# Patient Record
Sex: Female | Born: 1960 | Race: Black or African American | Hispanic: No | Marital: Married | Smoking: Current some day smoker
Health system: Southern US, Community
[De-identification: ages and names within clinical notes are randomized; demographics above are authoritative.]

---

## 2001-09-02 IMAGING — MG UNKNOWN MG STUDY
1 series · 4 of 4 positions shown · non-contrast
Comparison: none

REASON FOR EXAM: scr..hm[PHONE_NUMBER]

Procedure: DIGITAL SCREENING MAMMOGRAM WITH CAD:
 Comparison is made to prior study dated [DATE].
 The breasts are primarily fatty replaced. There is no evidence of malignant
type calcifications, regions of architectural distortion, spiculated masses
or densities.

[Series 6326: R CC · right · 4 of 4 slices shown]
[im 1/4]
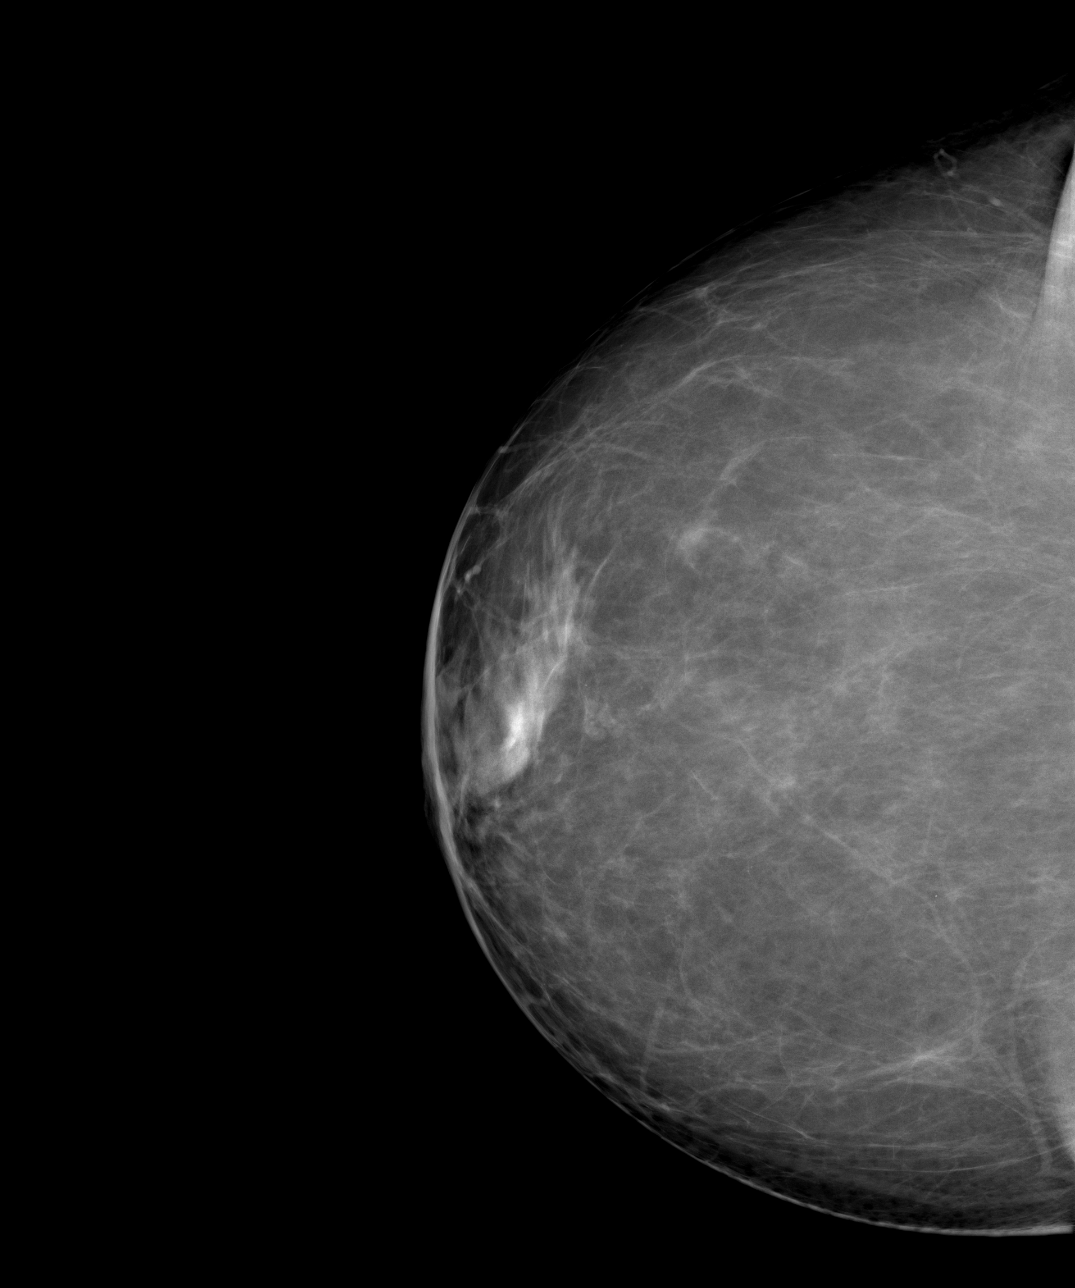
[im 2/4]
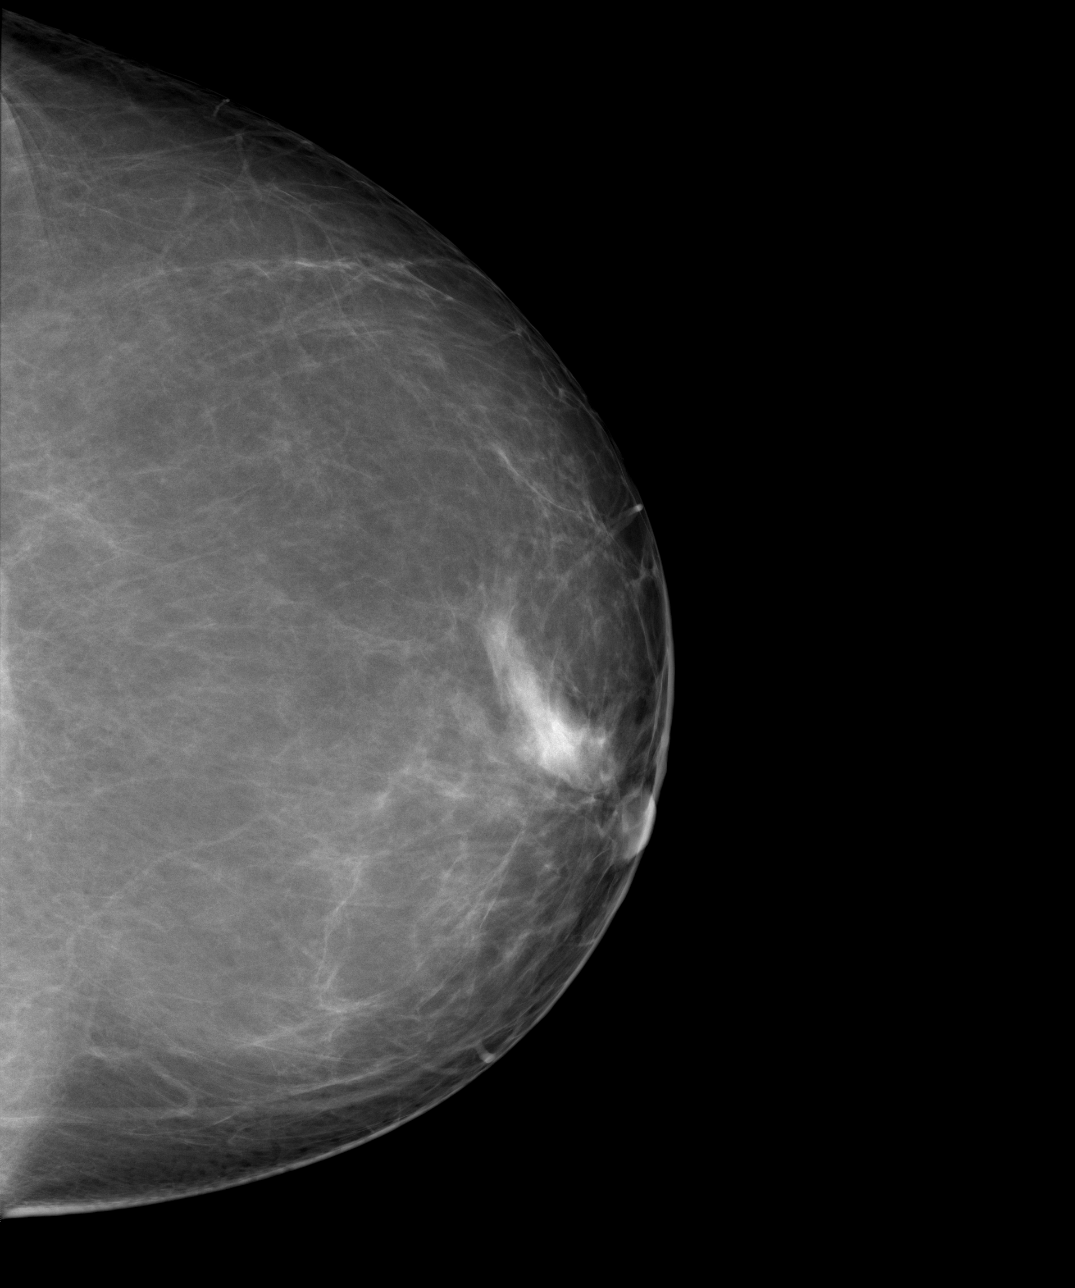
[im 3/4]
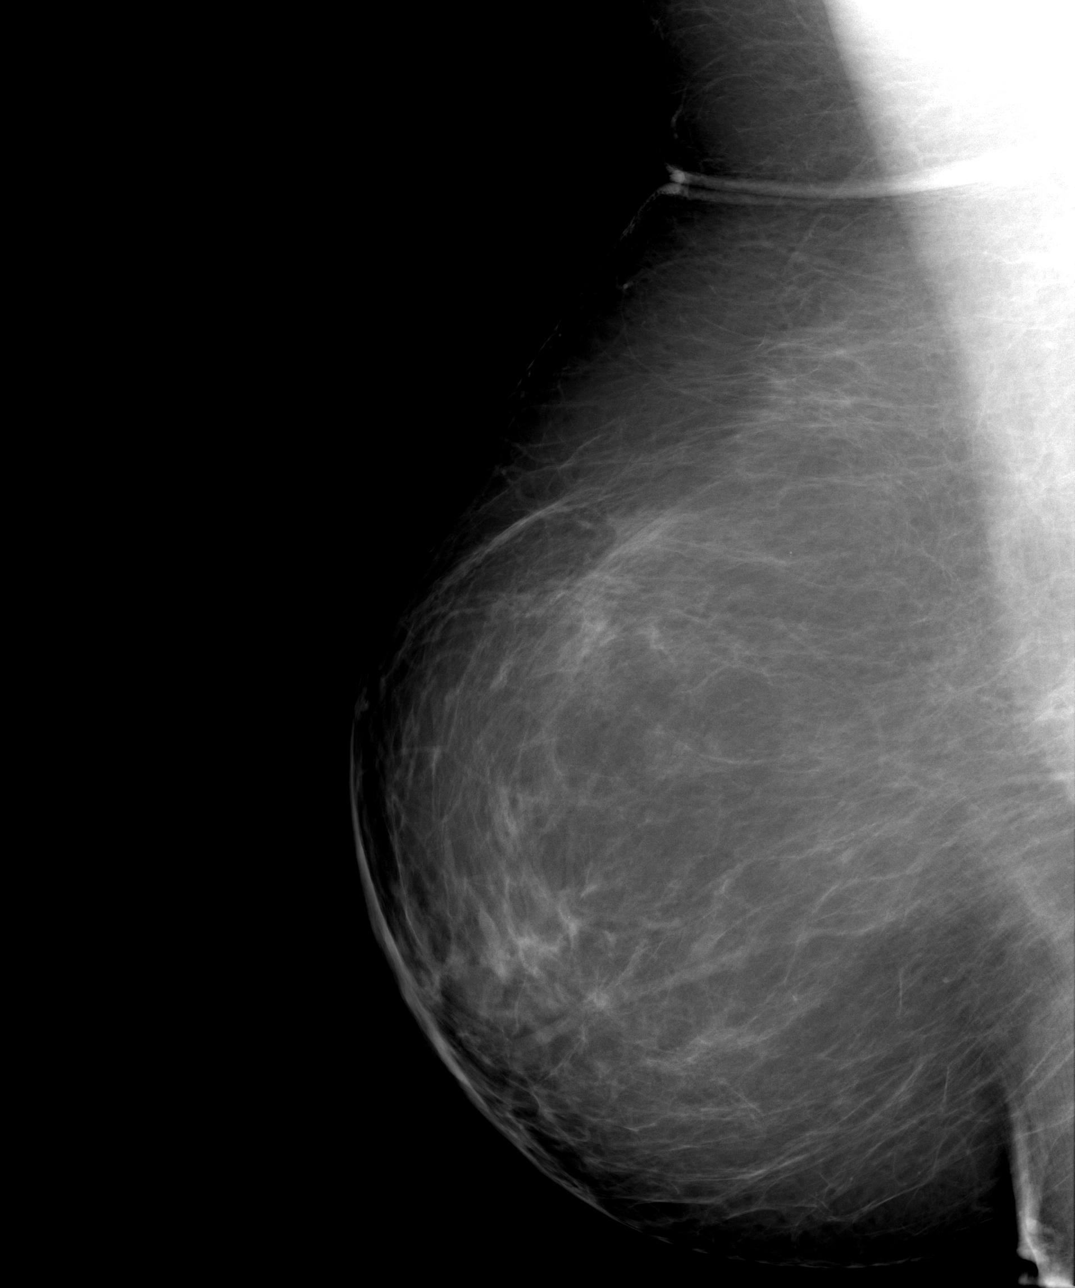
[im 4/4]
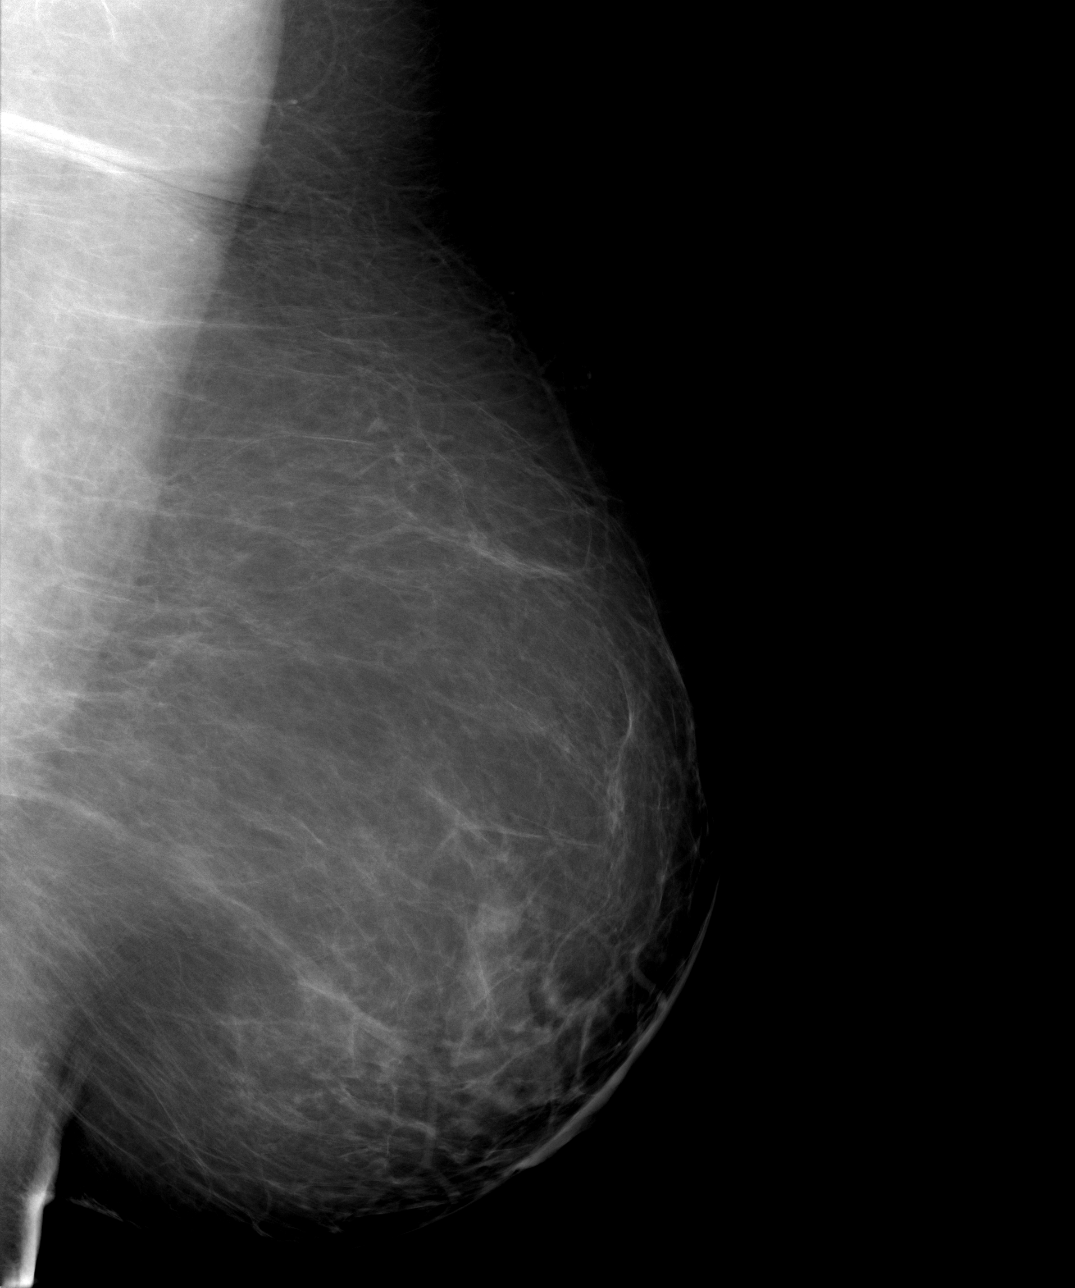

[4 of 4 positions shown; findings below may reference images not displayed]

IMPRESSION: 1) Benign findings.
 2) BI-RADS: Category 2-Benign Finding mammogram and annual follow-up is
recommended.

 A NEGATIVE MAMMOGRAM REPORT DOES NOT PRECLUDE BIOPSY OR OTHER EVALUATION OF
A CLINICALLY PALPABLE OR OTHERWISE SUSPICIOUS MASS OR LESION. BREAST CANCER
MAY NOT BE DETECTED BY MAMMOGRAPHY IN UP TO 10% OF CASES.

## 2002-09-05 IMAGING — MG UNKNOWN MG STUDY
1 series · 4 of 4 positions shown · non-contrast
Comparison: none

REASON FOR EXAM: Screening

Procedure: DIGITAL BILATERAL SCREENING MAMMOGRAPHY WITH CAD
 No dominant masses or pathologic clustered calcifications are demonstrated.
The exam is stable from prior exams. Routine yearly follow up is
recommended.

[Series 7481: R CC · right · 4 of 4 slices shown]
[im 1/4]
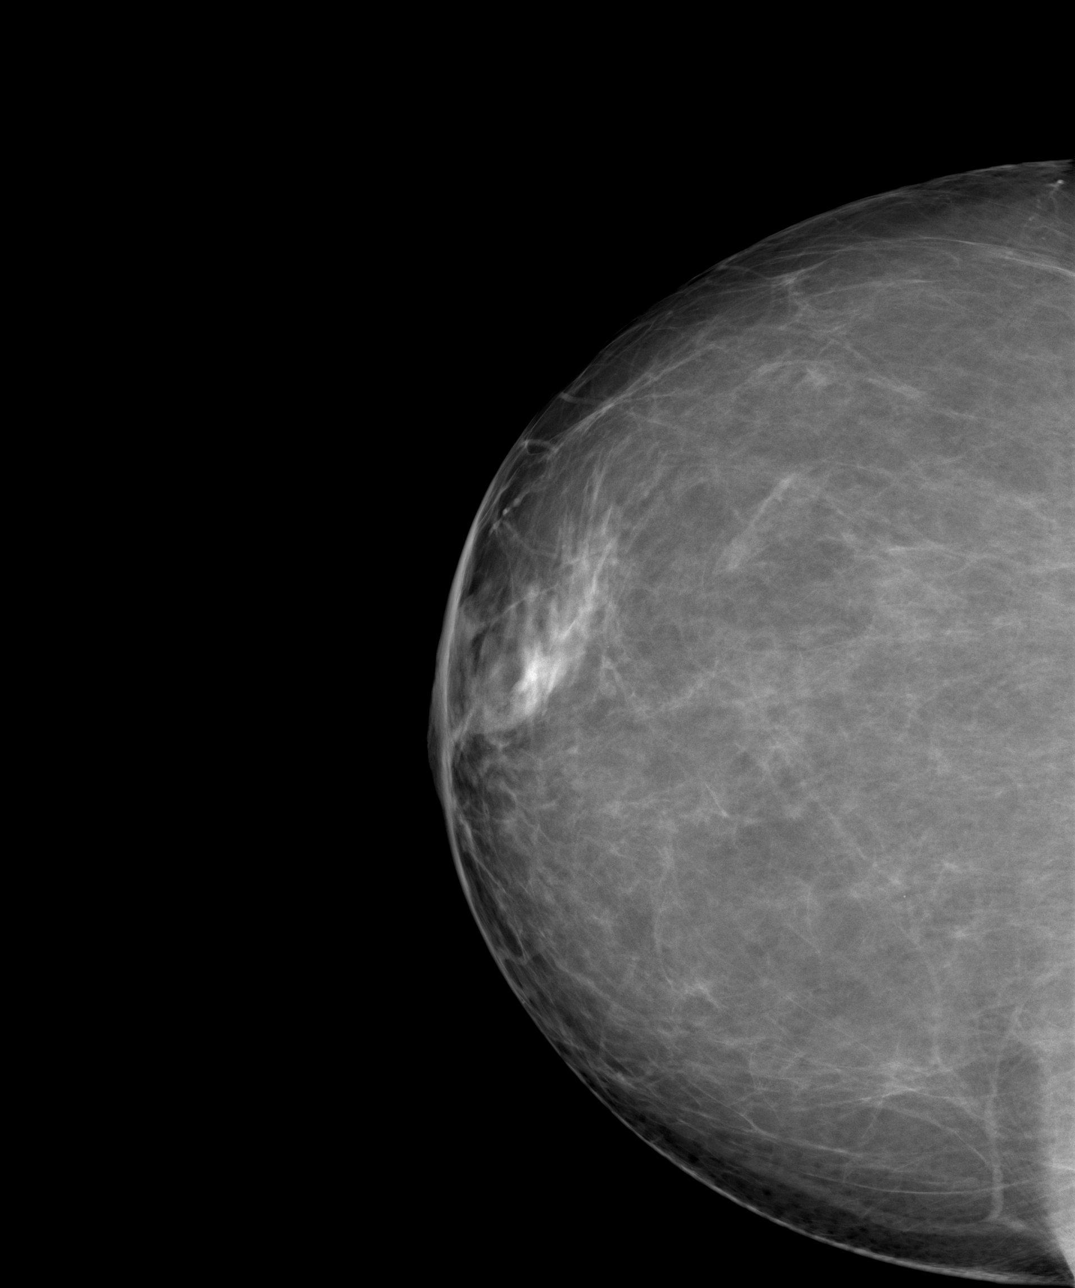
[im 2/4]
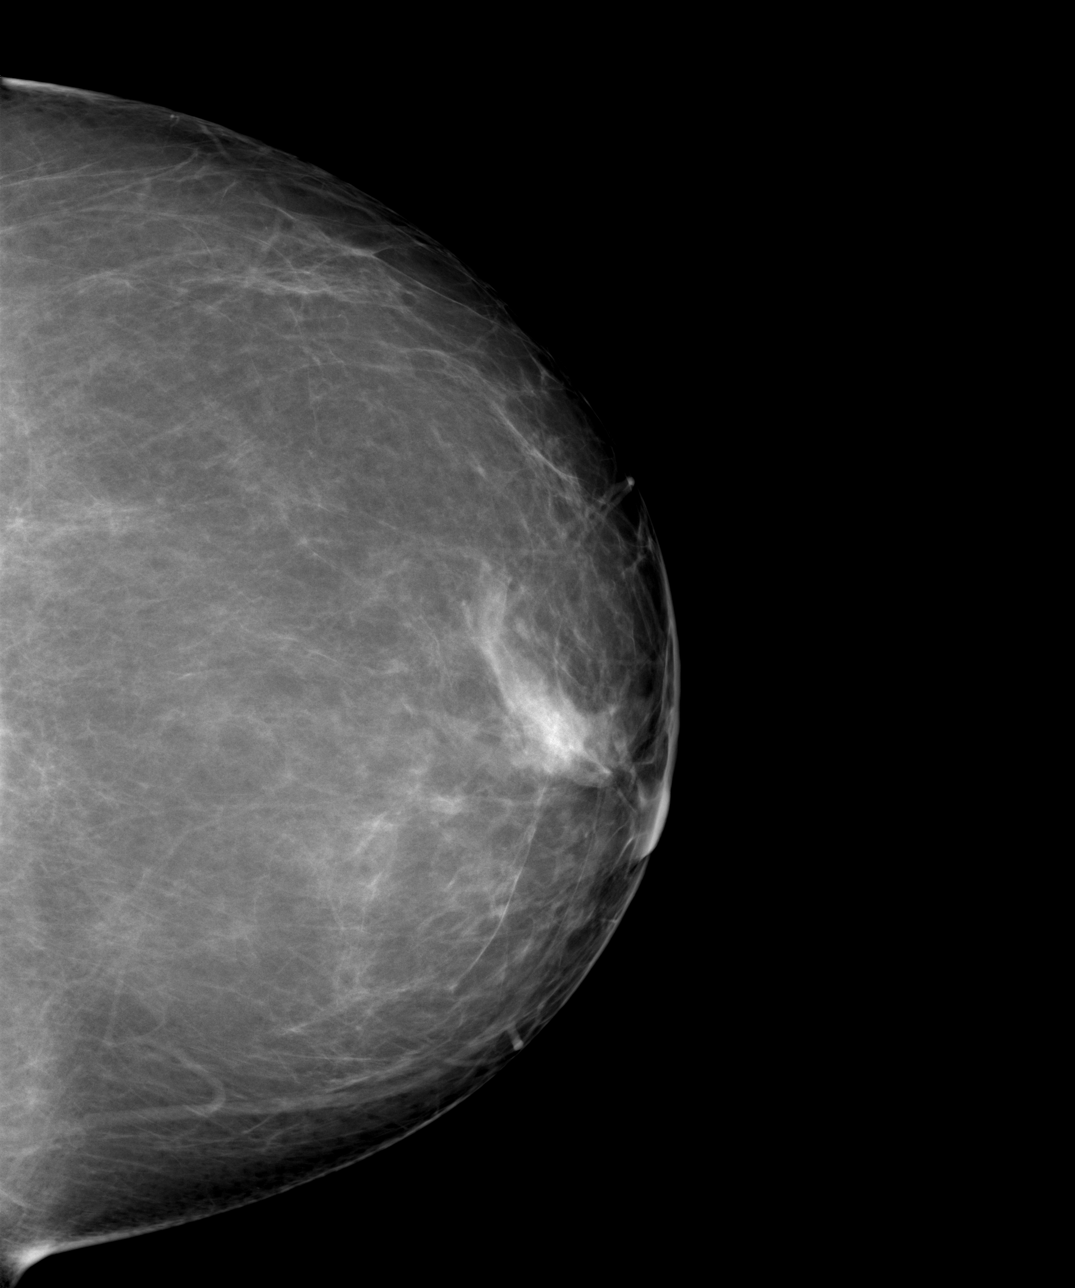
[im 3/4]
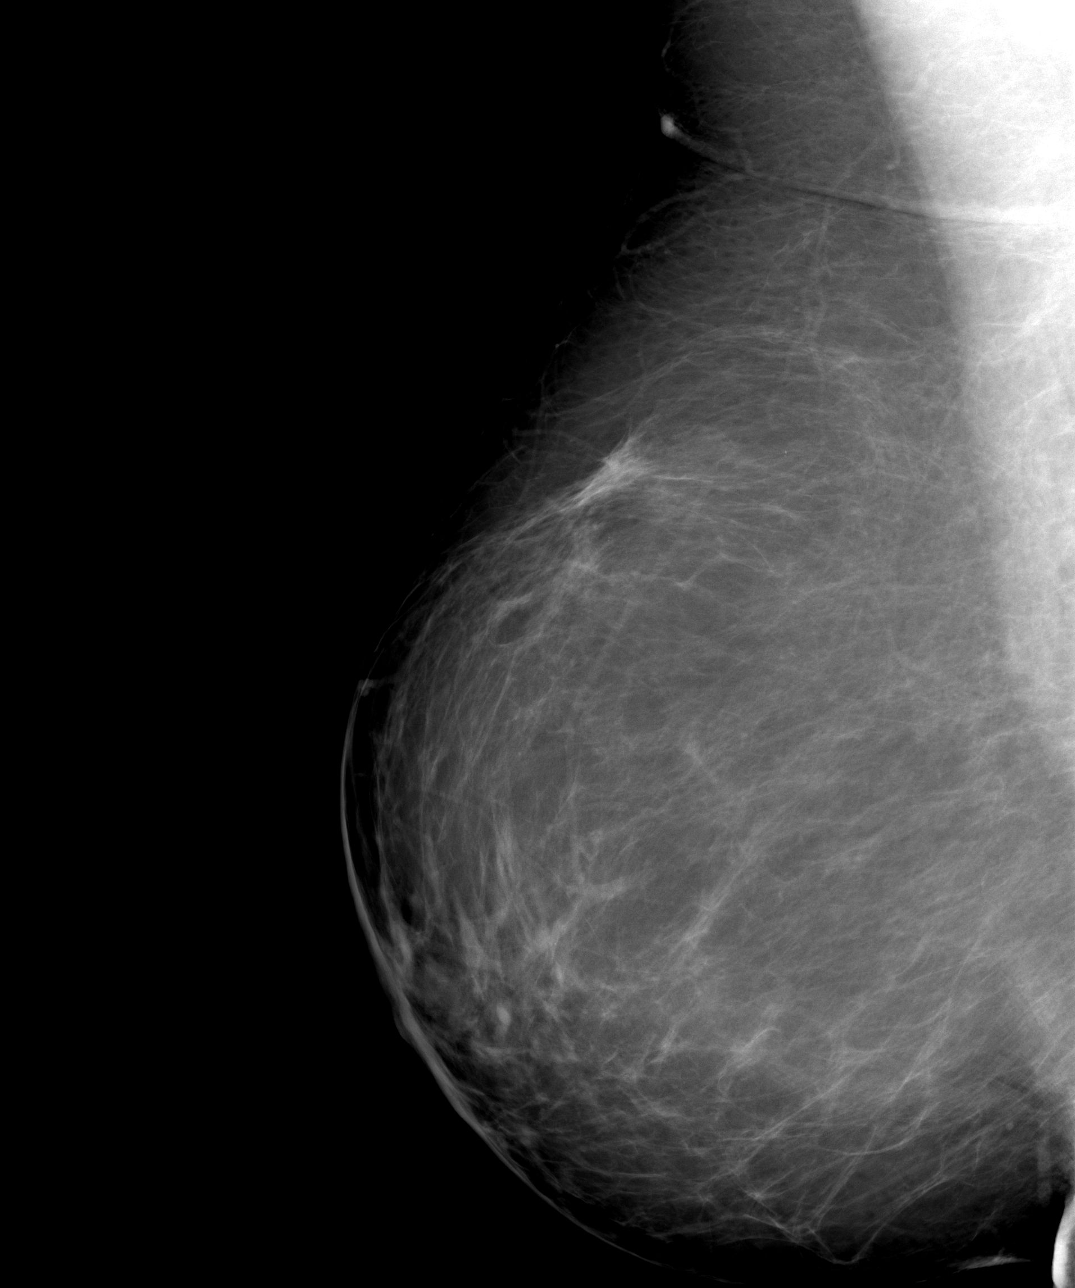
[im 4/4]
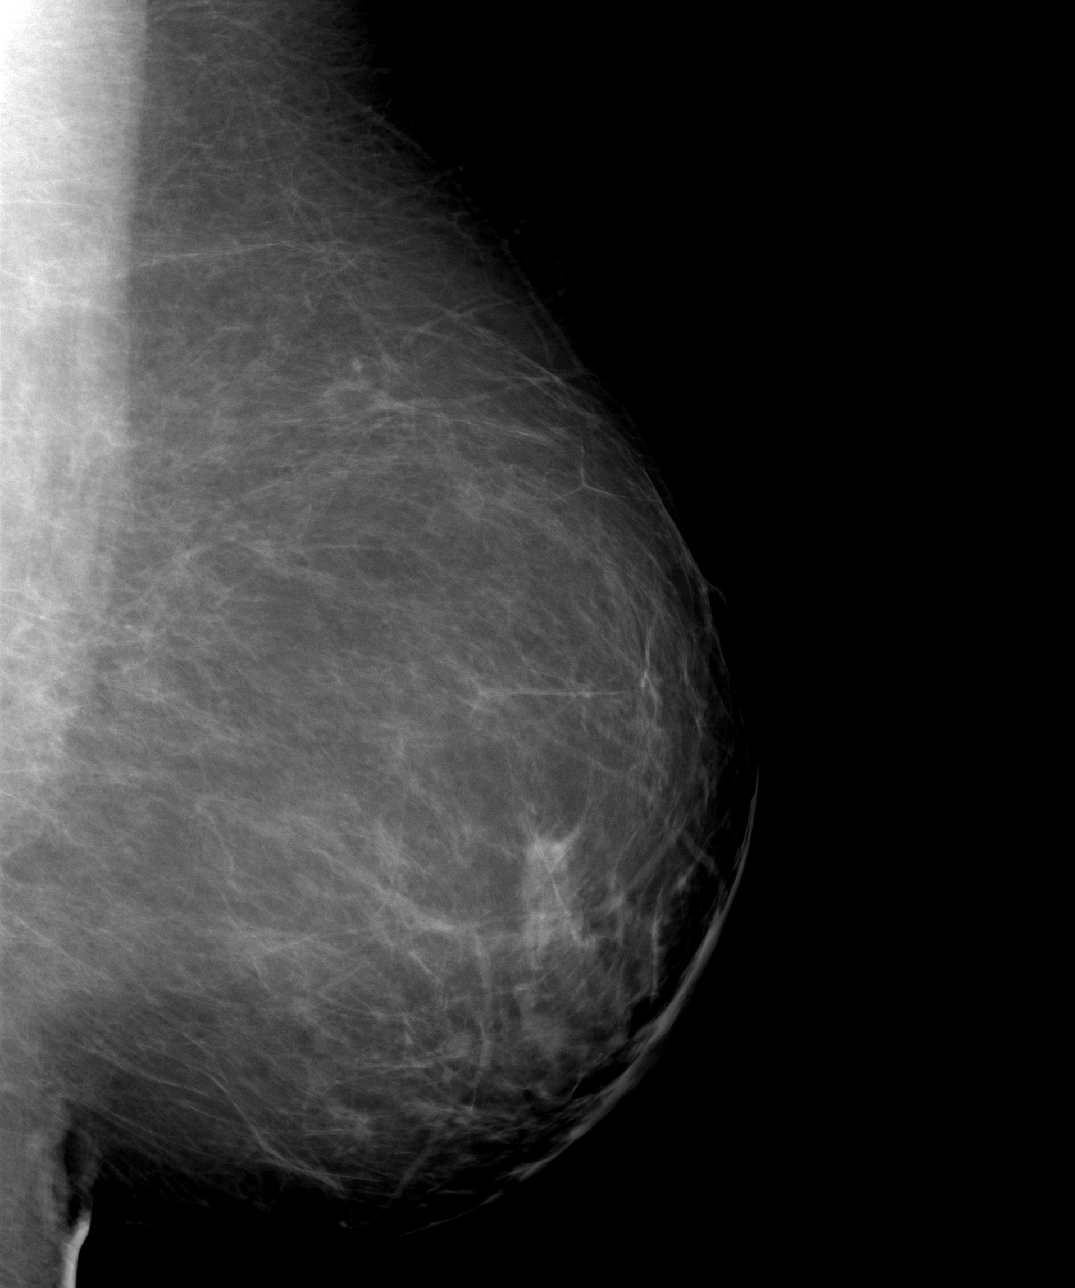

[4 of 4 positions shown; findings below may reference images not displayed]

IMPRESSION: Stable benign exam. Yearly follow up mammogram is suggested. BI-RADS:
Category 2-Benign Findings.

 A NEGATIVE MAMMOGRAM REPORT DOES NOT PRECLUDE BIOPSY OR OTHER EVALUATION OF
A CLINICALLY PALPABLE OR OTHERWISE SUSPICIOUS MASS OR LESION. BREAST CANCER
MAY NOT BE DETECTED BY MAMMOGRAPHY IN UP TO 10% OF CASES.

## 2005-12-01 ENCOUNTER — Ambulatory Visit: Payer: Self-pay | Admitting: Unknown Physician Specialty

## 2007-03-03 ENCOUNTER — Ambulatory Visit: Payer: Self-pay

## 2010-02-26 ENCOUNTER — Ambulatory Visit: Payer: Self-pay

## 2019-09-01 ENCOUNTER — Ambulatory Visit: Payer: Self-pay | Attending: Internal Medicine

## 2019-09-01 DIAGNOSIS — Z23 Encounter for immunization: Secondary | ICD-10-CM

## 2019-09-01 NOTE — Progress Notes (Signed)
   Covid-19 Vaccination Clinic  Name:  Sonia Holmes    MRN: [id number] DOB: 06/09/1960  09/01/2019  [name] was observed post Covid-19 immunization for 15 minutes without incident. She was provided with Vaccine Information Sheet and instruction to access the V-Safe system.   [name] was instructed to call 911 with any severe reactions post vaccine: Marland Kitchen Difficulty breathing  . Swelling of face and throat  . A fast heartbeat  . A bad rash all over body  . Dizziness and weakness   Immunizations Administered    Name Date Dose VIS Date Route   Pfizer COVID-19 Vaccine 09/01/2019  9:34 AM 0.3 mL 06/14/2018 Intramuscular   Manufacturer: [employer], [employer]   Lot: [id number]   NDC: [address]-9

## 2019-09-22 ENCOUNTER — Ambulatory Visit: Payer: Self-pay

## 2019-09-29 ENCOUNTER — Ambulatory Visit: Payer: Self-pay | Attending: Oncology

## 2019-09-29 ENCOUNTER — Other Ambulatory Visit: Payer: Self-pay

## 2019-09-29 DIAGNOSIS — Z23 Encounter for immunization: Secondary | ICD-10-CM

## 2019-09-29 NOTE — Progress Notes (Signed)
   Covid-19 Vaccination Clinic  Name:  Sonia Holmes    MRN: [id number] DOB: 1960-09-19  09/29/2019  [name] was observed post Covid-19 immunization for 15 minutes without incident. She was provided with Vaccine Information Sheet and instruction to access the V-Safe system.   [name] was instructed to call 911 with any severe reactions post vaccine: Marland Kitchen Difficulty breathing  . Swelling of face and throat  . A fast heartbeat  . A bad rash all over body  . Dizziness and weakness   Immunizations Administered    Name Date Dose VIS Date Route   Pfizer COVID-19 Vaccine 09/29/2019  8:47 AM 0.3 mL 06/14/2018 Intramuscular   Manufacturer: [employer], [employer]   Lot: [id number]   NDC: [address]-2

## 2021-09-05 ENCOUNTER — Emergency Department: Payer: BC Managed Care – PPO

## 2021-09-05 ENCOUNTER — Encounter: Payer: Self-pay | Admitting: Intensive Care

## 2021-09-05 ENCOUNTER — Emergency Department
Admission: EM | Admit: 2021-09-05 | Discharge: 2021-09-05 | Disposition: A | Discharge status: Home / Self Care | Payer: BC Managed Care – PPO | Attending: Emergency Medicine | Admitting: Emergency Medicine

## 2021-09-05 ENCOUNTER — Other Ambulatory Visit: Payer: Self-pay

## 2021-09-05 DIAGNOSIS — R519 Headache, unspecified: Secondary | ICD-10-CM | POA: Diagnosis present

## 2021-09-05 DIAGNOSIS — G459 Transient cerebral ischemic attack, unspecified: Secondary | ICD-10-CM | POA: Diagnosis not present

## 2021-09-05 LAB — CBC
HCT: 40.2 % (ref 36.0–46.0)
Hemoglobin: 12.4 g/dL (ref 12.0–15.0)
MCH: 26.2 pg (ref 26.0–34.0)
MCHC: 30.8 g/dL (ref 30.0–36.0)
MCV: 85 fL (ref 80.0–100.0)
Platelets: 149 10*3/uL — ABNORMAL LOW (ref 150–400)
RBC: 4.73 MIL/uL (ref 3.87–5.11)
RDW: 12.2 % (ref 11.5–15.5)
WBC: 4.1 10*3/uL (ref 4.0–10.5)
nRBC: 0 % (ref 0.0–0.2)

## 2021-09-05 LAB — DIFFERENTIAL
Abs Immature Granulocytes: 0.01 10*3/uL (ref 0.00–0.07)
Basophils Absolute: 0 10*3/uL (ref 0.0–0.1)
Basophils Relative: 1 %
Eosinophils Absolute: 0.1 10*3/uL (ref 0.0–0.5)
Eosinophils Relative: 3 %
Immature Granulocytes: 0 %
Lymphocytes Relative: 31 %
Lymphs Abs: 1.3 10*3/uL (ref 0.7–4.0)
Monocytes Absolute: 0.3 10*3/uL (ref 0.1–1.0)
Monocytes Relative: 6 %
Neutro Abs: 2.4 10*3/uL (ref 1.7–7.7)
Neutrophils Relative %: 59 %

## 2021-09-05 LAB — COMPREHENSIVE METABOLIC PANEL
ALT: 14 U/L (ref 0–44)
AST: 21 U/L (ref 15–41)
Albumin: 4.2 g/dL (ref 3.5–5.0)
Alkaline Phosphatase: 94 U/L (ref 38–126)
Anion gap: 7 (ref 5–15)
BUN: 15 mg/dL (ref 8–23)
CO2: 28 mmol/L (ref 22–32)
Calcium: 9.1 mg/dL (ref 8.9–10.3)
Chloride: 106 mmol/L (ref 98–111)
Creatinine, Ser: 0.73 mg/dL (ref 0.44–1.00)
GFR, Estimated: >60 mL/min (ref >60)
Glucose, Bld: 85 mg/dL (ref 70–99)
Potassium: 3.9 mmol/L (ref 3.5–5.1)
Sodium: 141 mmol/L (ref 135–145)
Total Bilirubin: 0.6 mg/dL (ref 0.3–1.2)
Total Protein: 7.9 g/dL (ref 6.5–8.1)

## 2021-09-05 LAB — PROTIME-INR
INR: 1 (ref 0.8–1.2)
Prothrombin Time: 13.5 seconds (ref 11.4–15.2)

## 2021-09-05 LAB — APTT: aPTT: 32 seconds (ref 24–36)

## 2021-09-05 IMAGING — MR MR MRA HEAD W/O CM
4 of 5 series · 22 of 48 positions shown · non-contrast
Comparison: Same-day noncontrast CT head

CLINICAL DATA: Episode of vision loss and bilateral jaw pain
yesterday, headaches, dizziness



[Series 5: ax dwi_tracew · axial · 3.0mm · 0.71mm/px · z∈[-126,+38]mm · 6 of 56 slices shown]
[im 1/56]
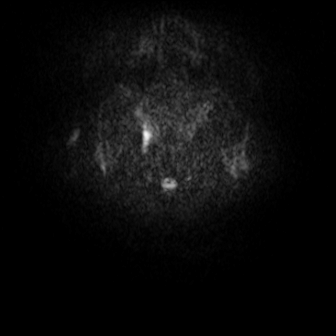
[im 12/56]
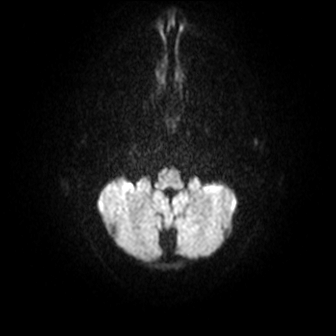
[im 23/56]
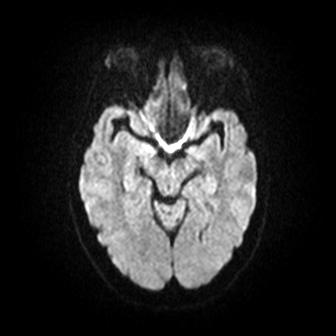
[im 34/56]
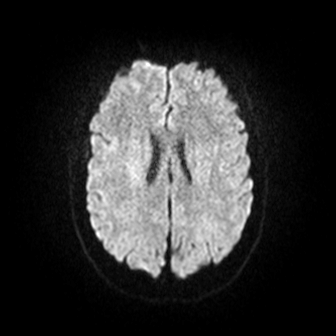
[im 45/56]
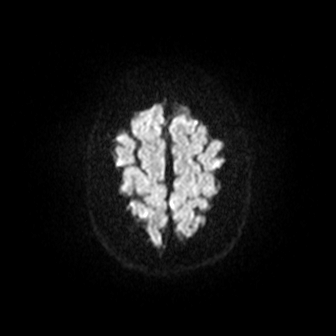
[im 56/56]
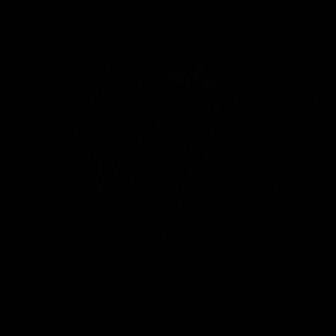

[Series 6: ax dwi_adc · axial · 3.0mm · 0.71mm/px · z∈[-126,+35]mm · 6 of 55 slices shown]
[im 1/55]
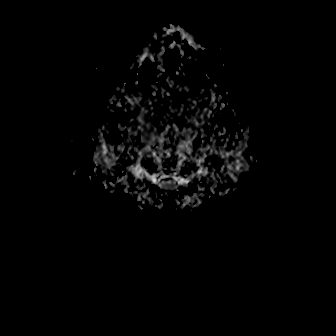
[im 11/55]
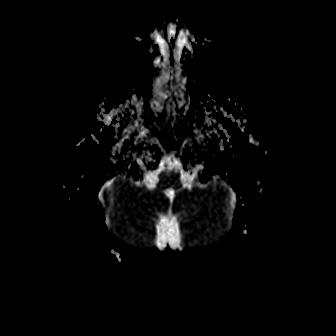
[im 22/55]
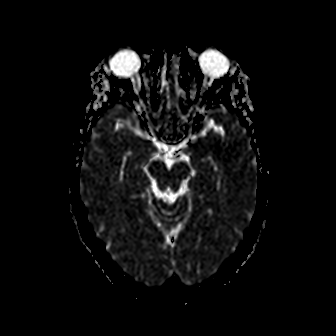
[im 33/55]
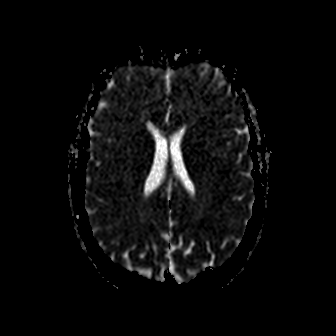
[im 44/55]
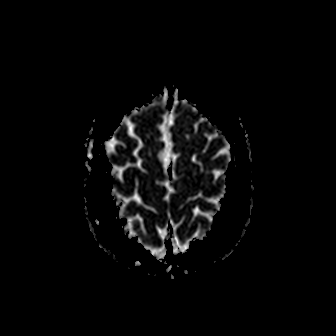
[im 55/55]
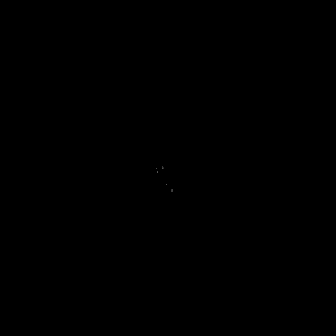

[Series 7: cor dwi_tracew · coronal · 5.0mm · 0.68mm/px · 5 of 38 slices shown]
[im 1/38]
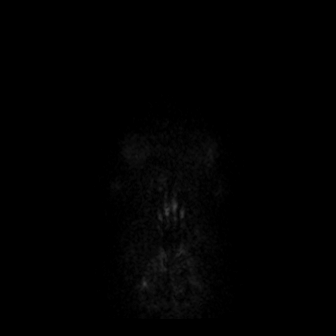
[im 10/38]
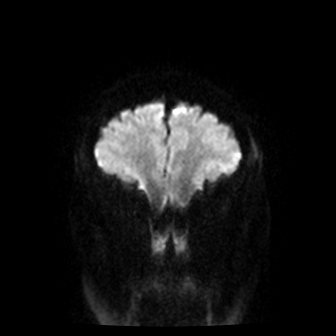
[im 19/38]
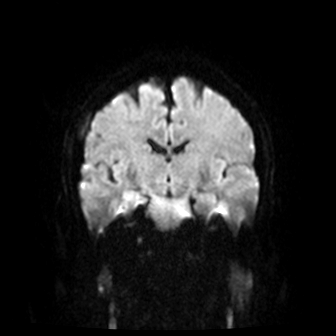
[im 28/38]
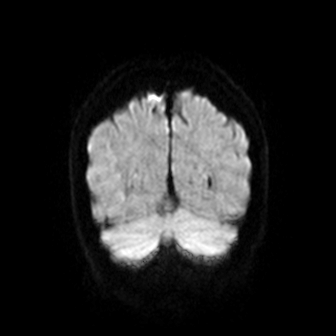
[im 38/38]
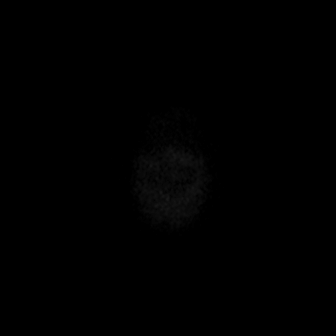

[Series 8: cor dwi_adc · coronal · 5.0mm · 0.68mm/px · 5 of 38 slices shown]
[im 1/38]
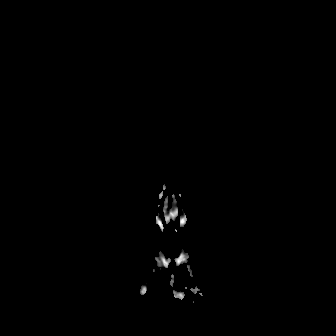
[im 10/38]
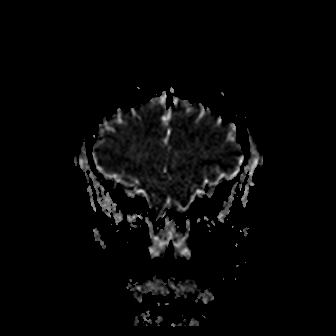
[im 19/38]
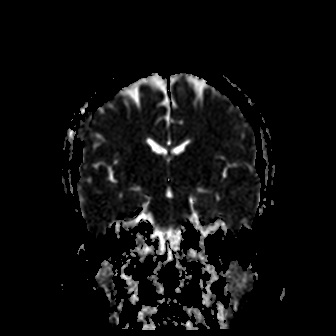
[im 28/38]
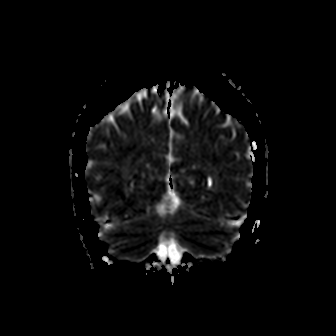
[im 38/38]
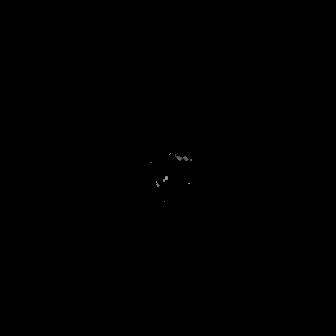

[22 of 48 positions shown; findings below may reference images not displayed]

FINDINGS: MRI HEAD FINDINGS

Brain: There is no acute intracranial hemorrhage, extra-axial fluid
collection, or acute infarct.

Parenchymal volume is normal. The ventricles are normal in size.
Gray-white differentiation is preserved. Scattered small foci of
FLAIR signal abnormality in the subcortical and periventricular
white matter are nonspecific but most likely reflects sequela of
mild chronic white matter microangiopathy.

There is no mass lesion.  There is no mass effect or midline shift.

Vascular: See below.

Skull and upper cervical spine: Normal marrow signal.

Sinuses/Orbits: The paranasal sinuses are clear. The globes and
orbits are unremarkable.

Other: None.

MRA HEAD FINDINGS

Anterior circulation: The intracranial ICAs are patent.

Bilateral MCAs are patent.

The bilateral ACAs are patent. The anterior communicating artery is
normal.

There is no aneurysm or AVM.

Posterior circulation: The bilateral V4 segments are patent. PICA is
identified bilaterally. The basilar artery is patent.

The bilateral PCAs are patent. The posterior communicating arteries
are not definitely seen.

There is no aneurysm or AVM.

Anatomic variants: None.

MRA NECK FINDINGS

Aortic arch: The imaged aortic arch is grossly unremarkable. The
origins of the major branch vessels appear patent.

Right carotid system: Right common, internal, and external carotid
arteries are patent, without evidence of hemodynamically significant
stenosis or occlusion. There is no evidence of dissection or
aneurysm.

Left carotid system: The left common, internal, and external carotid
arteries appear patent, without evidence of hemodynamically
significant stenosis or occlusion. There is no evidence of
dissection or aneurysm.

Vertebral arteries: Vertebral arteries are patent with antegrade
flow. There is no evidence of hemodynamically significant stenosis
or occlusion. There is no evidence of dissection or aneurysm.

Other: None.
IMPRESSION: 1. No acute intracranial pathology.
2. Patent vasculature of the head and neck with no hemodynamically
significant stenosis or occlusion.

## 2021-09-05 IMAGING — CT CT HEAD W/O CM
4 series · 17 of 47 positions shown, 19 images · non-contrast
Comparison: No pertinent prior exams available for comparison.

CLINICAL DATA: Provided history: Neuro deficit, acute, stroke
suspected. Additional history provided: Lightheadedness, dizziness,
balance difficulty.



[Series 2: head wo · axial · 0.45mm/px · z∈[-152,-32]mm · 7 of 33 slices shown, 9 images]
[im 5/33  brain]
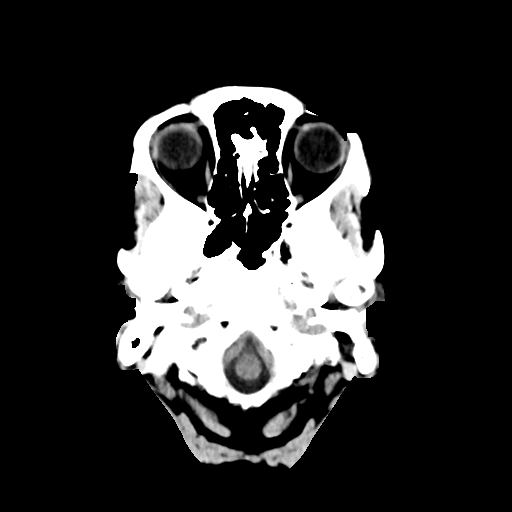
[im 5/33  bone]
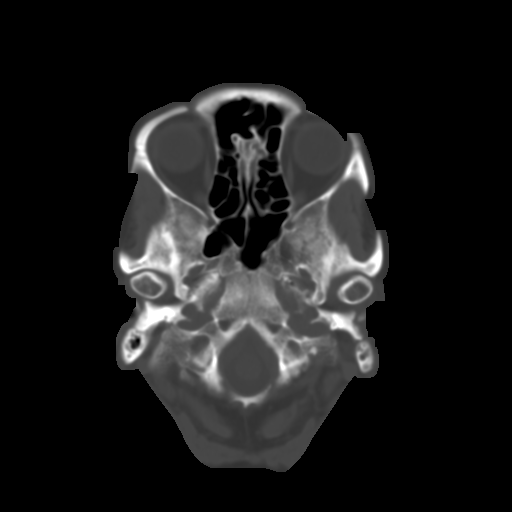
[im 9/33  brain]
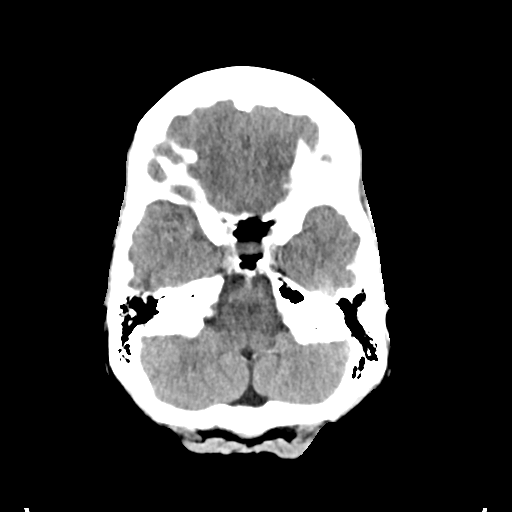
[im 13/33  brain]
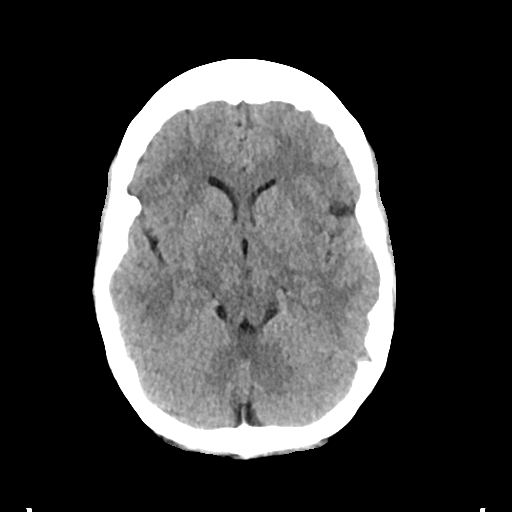
[im 17/33  brain]
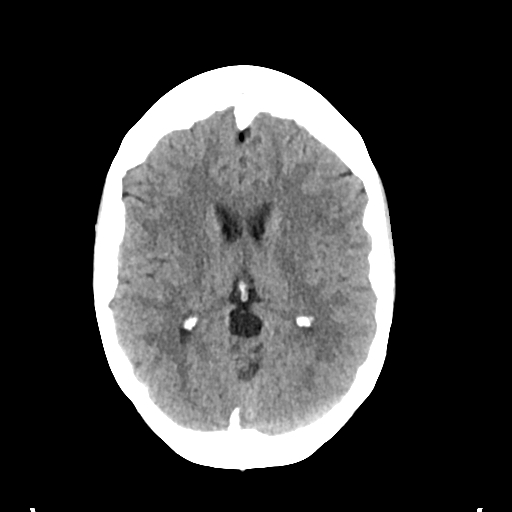
[im 21/33  brain]
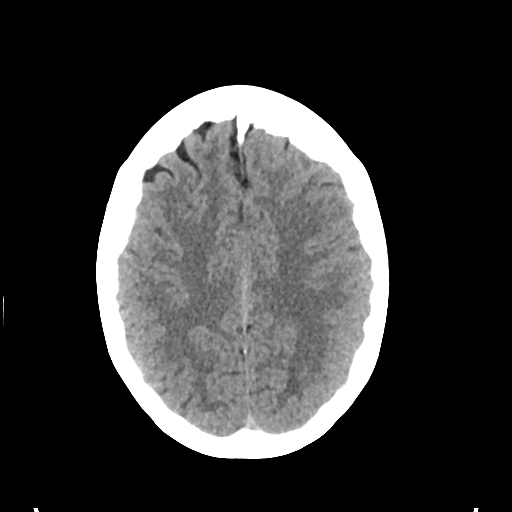
[im 21/33  bone]
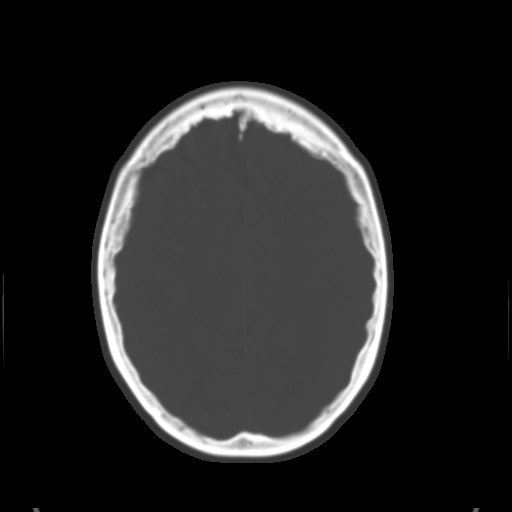
[im 25/33  brain]
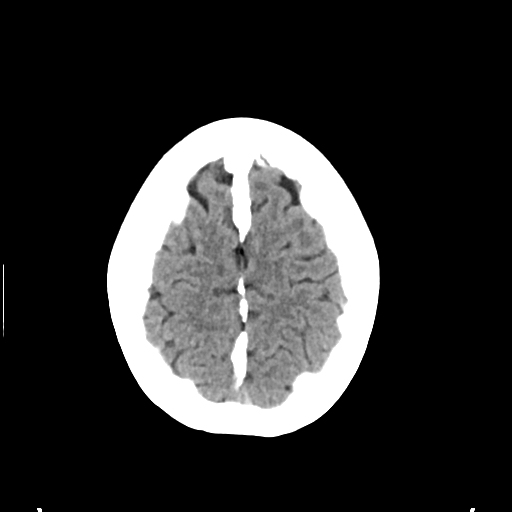
[im 29/33  brain]
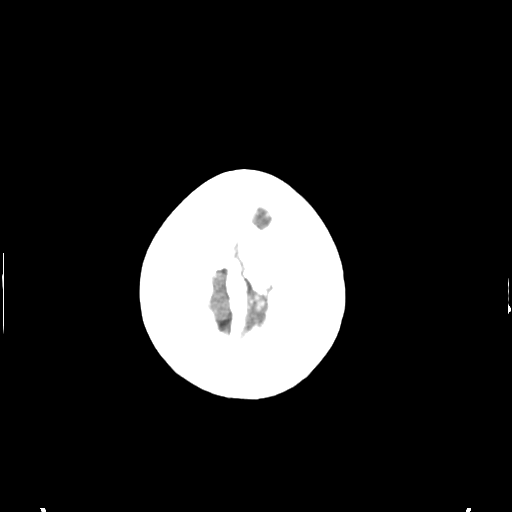

[Series 3: head bone · axial · 0.45mm/px · z∈[-156,-100]mm · 4 of 82 slices shown]
[im 9/82  bone]
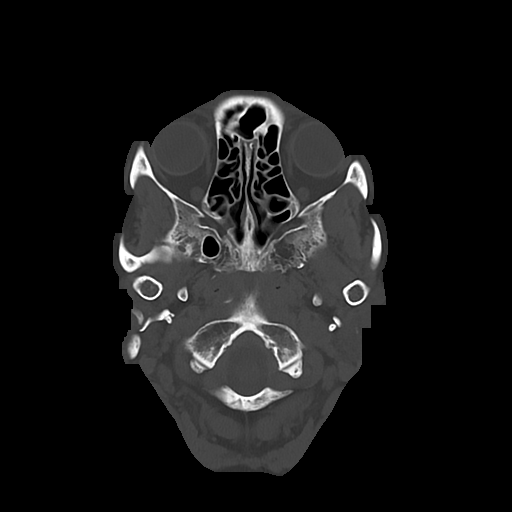
[im 17/82  bone]
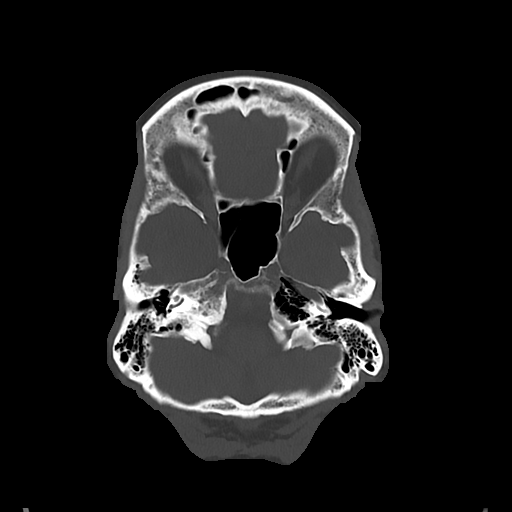
[im 25/82  bone]
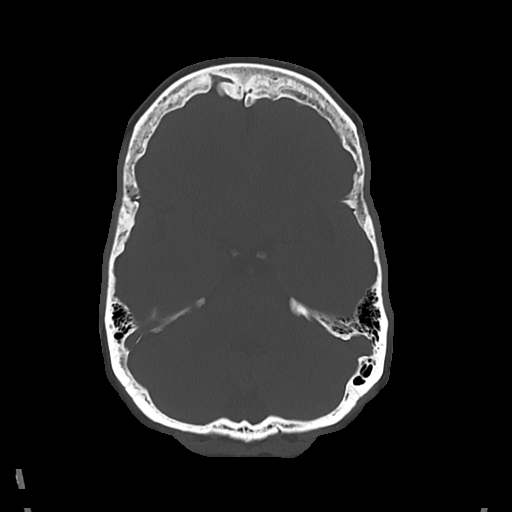
[im 37/82  bone]
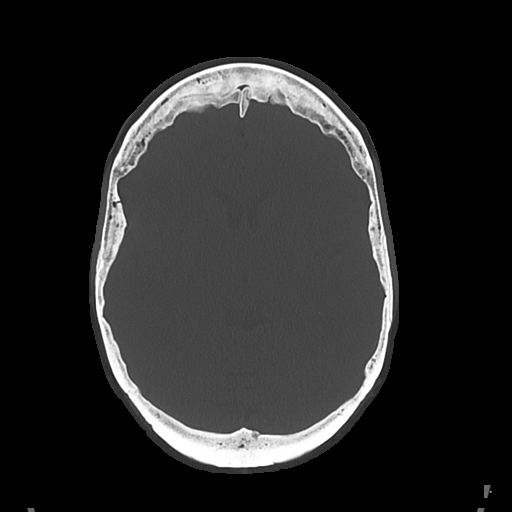

[Series 4: cor soft · coronal · 0.33mm/px · 3 of 66 slices shown]
[im 22/66  brain]
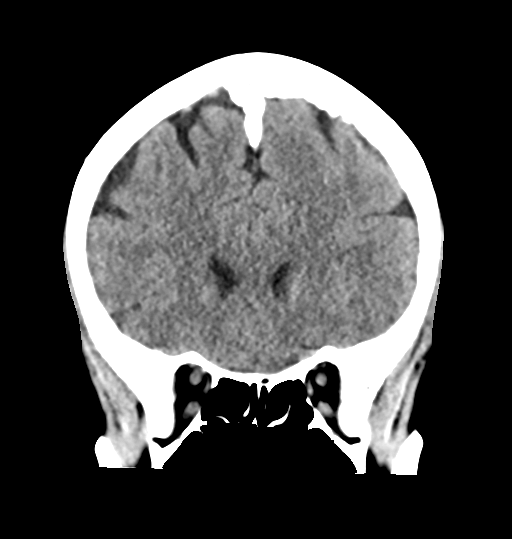
[im 29/66  brain]
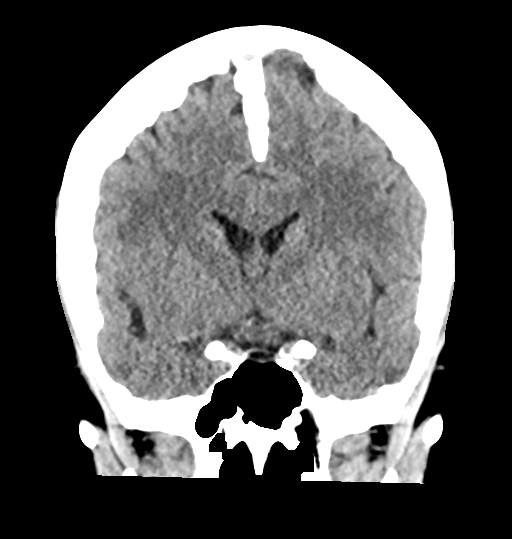
[im 37/66  brain]
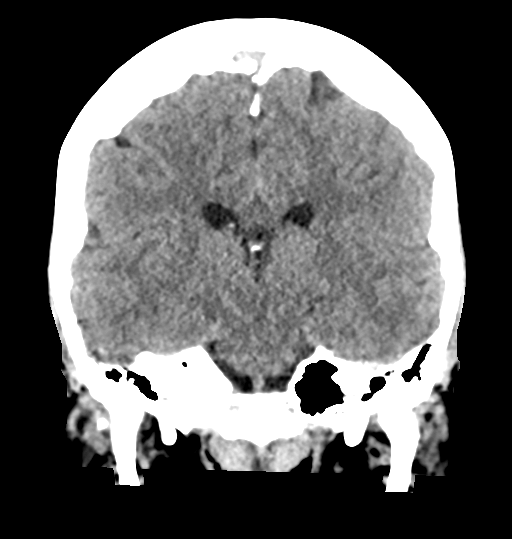

[Series 5: sag soft · sagittal · 0.35mm/px · 3 of 57 slices shown]
[im 19/57  brain]
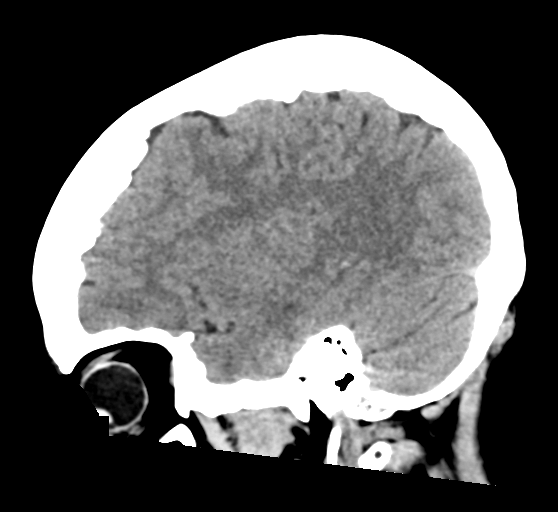
[im 29/57  brain]
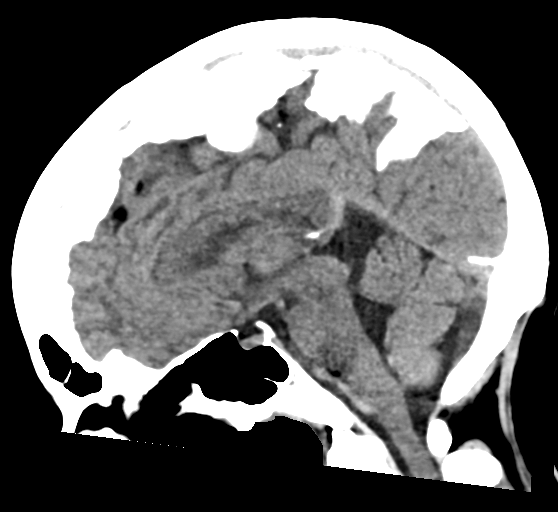
[im 38/57  brain]
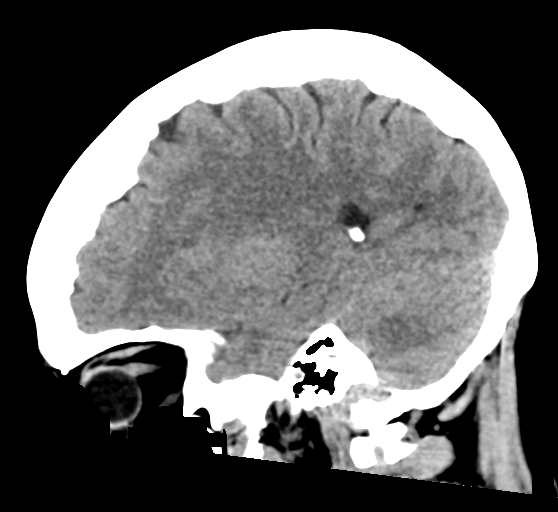

[17 of 47 positions shown; findings below may reference images not displayed]

FINDINGS: Brain:

Cerebral volume is normal.

Nonspecific punctate parenchymal calcification within the posterior
left temporal lobe.

There is no acute intracranial hemorrhage.

No demarcated cortical infarct.

No extra-axial fluid collection.

No evidence of an intracranial mass.

No midline shift.

Vascular: No hyperdense vessel. Atherosclerotic calcifications.

Skull: No fracture or aggressive osseous lesion.

Sinuses/Orbits: No mass or acute finding within the imaged orbits.
No significant paranasal sinus disease at the imaged levels.
IMPRESSION: No evidence of acute intracranial abnormality.

## 2021-09-05 IMAGING — MR MR MRA NECK W/O CM
3 series · 48 of 48 positions shown · non-contrast
Comparison: Same-day noncontrast CT head

CLINICAL DATA: Episode of vision loss and bilateral jaw pain
yesterday, headaches, dizziness



[Series 19: TOF post-contrast · axial · non-contrast · 0.8mm · 0.57mm/px · z∈[-302,-56]mm · 39 of 311 slices shown]
[im 1/311]
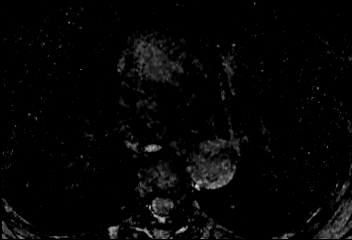
[im 9/311]
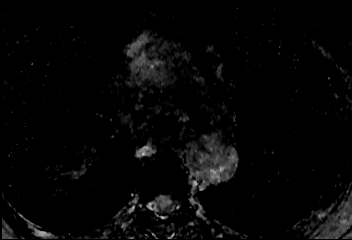
[im 17/311]
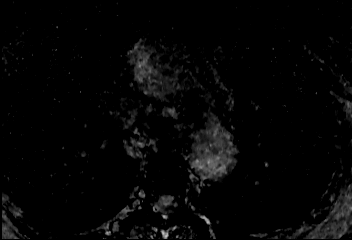
[im 25/311]
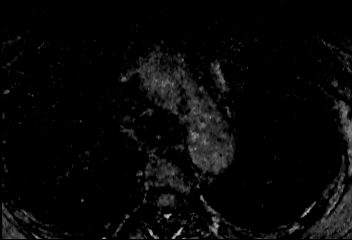
[im 33/311]
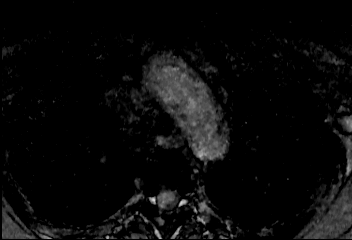
[im 41/311]
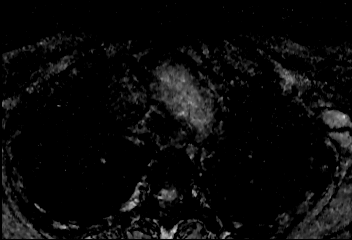
[im 49/311]
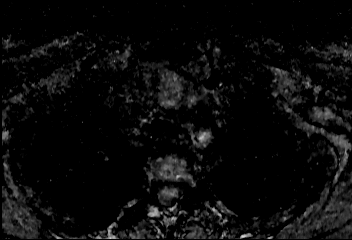
[im 58/311]
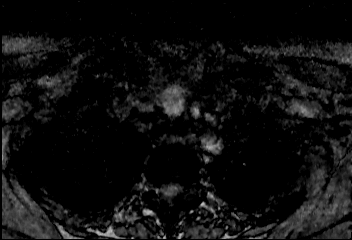
[im 66/311]
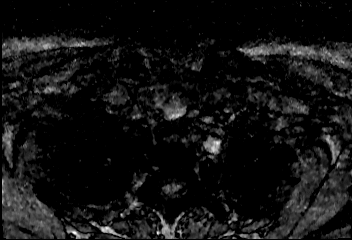
[im 74/311]
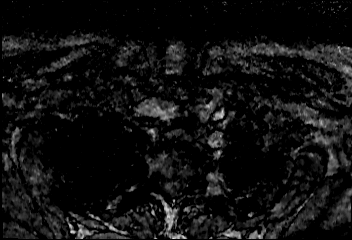
[im 82/311]
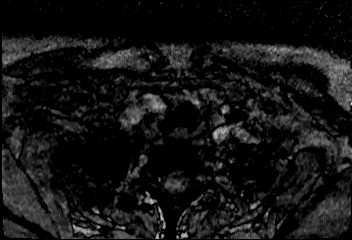
[im 90/311]
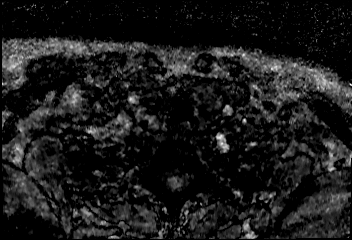
[im 98/311]
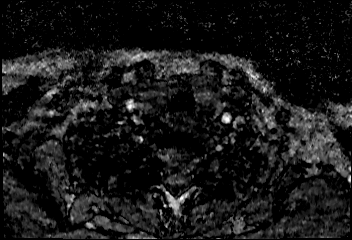
[im 107/311]
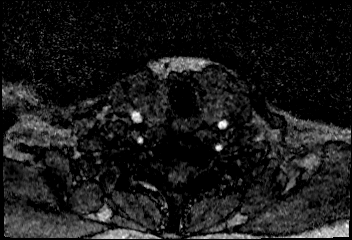
[im 115/311]
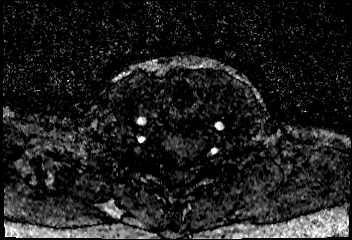
[im 123/311]
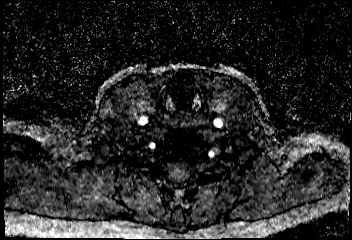
[im 131/311]
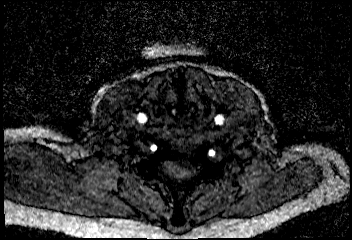
[im 139/311]
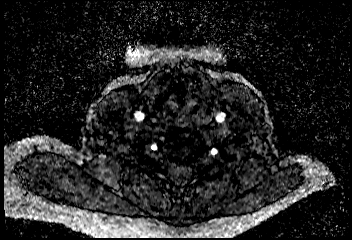
[im 147/311]
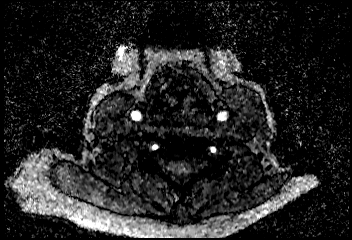
[im 155/311]
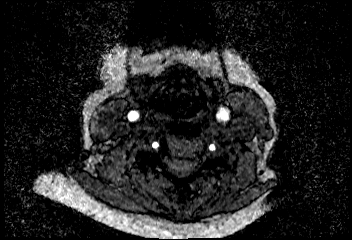
[im 164/311]
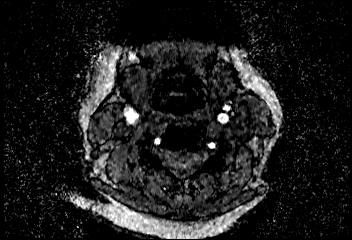
[im 172/311]
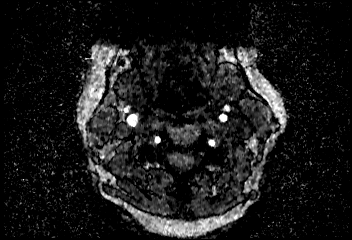
[im 180/311]
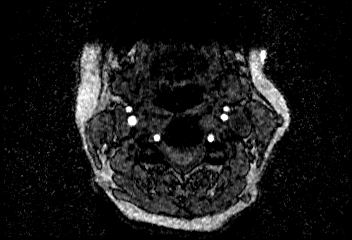
[im 188/311]
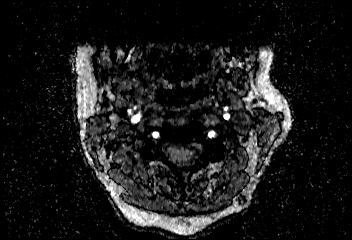
[im 196/311]
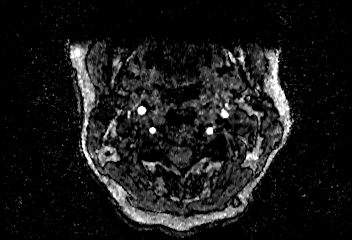
[im 204/311]
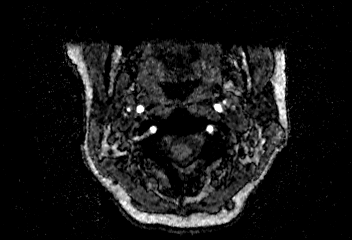
[im 213/311]
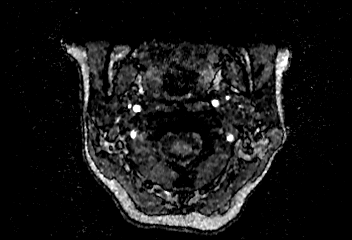
[im 221/311]
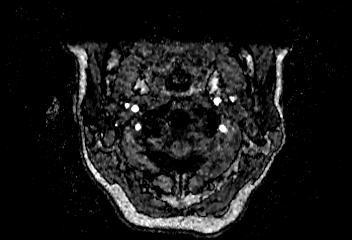
[im 229/311]
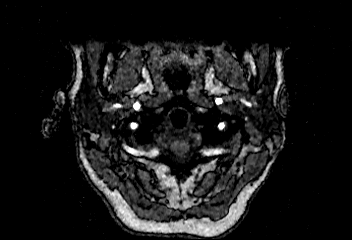
[im 237/311]
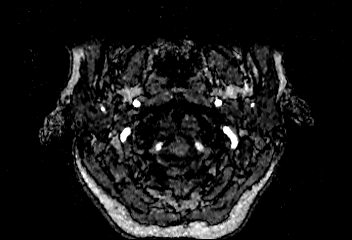
[im 245/311]
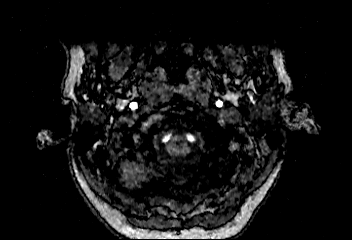
[im 253/311]
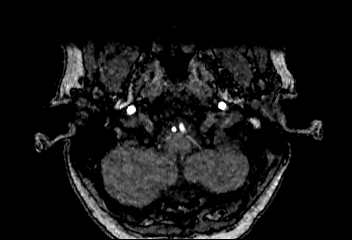
[im 262/311]
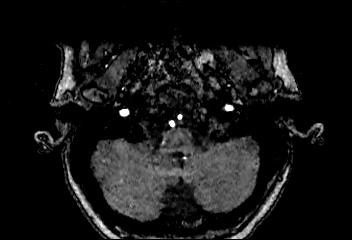
[im 270/311]
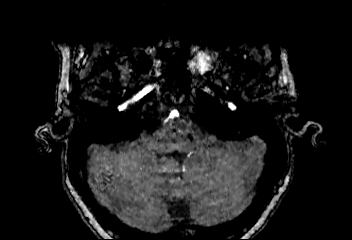
[im 278/311]
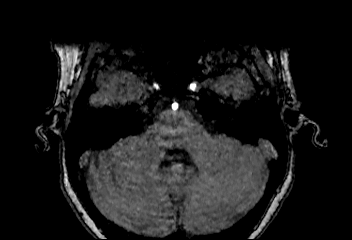
[im 286/311]
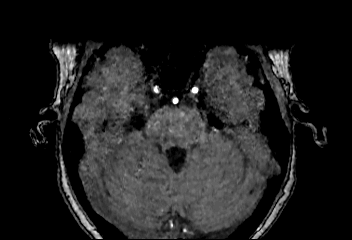
[im 294/311]
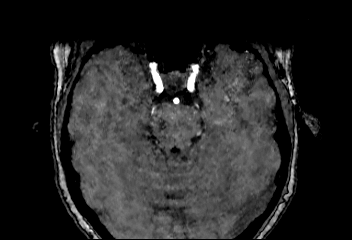
[im 302/311]
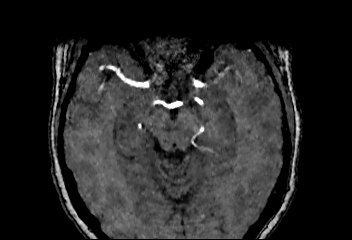
[im 311/311]
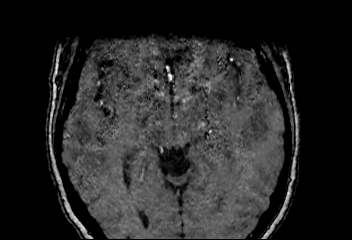

[Series 25: ax swi_pha · axial · 3.0mm · 0.90mm/px · z∈[-126,+38]mm · 7 of 56 slices shown]
[im 1/56]
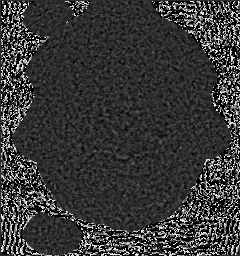
[im 10/56]
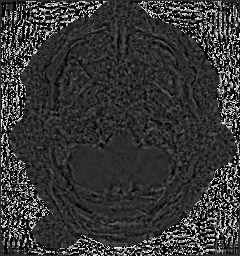
[im 19/56]
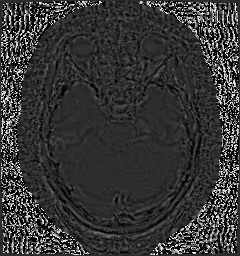
[im 28/56]
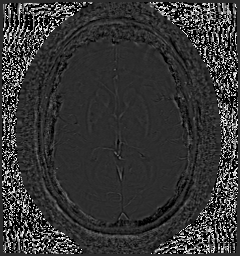
[im 37/56]
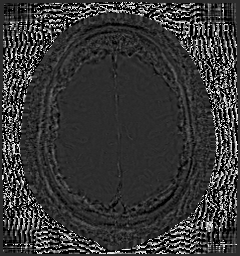
[im 46/56]
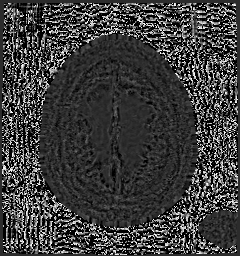
[im 56/56]
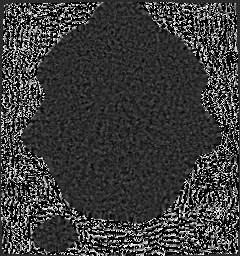

[Series 31: T1 post-contrast · sagittal · 5.0mm · 0.47mm/px · 2 of 20 slices shown]
[im 1/20]
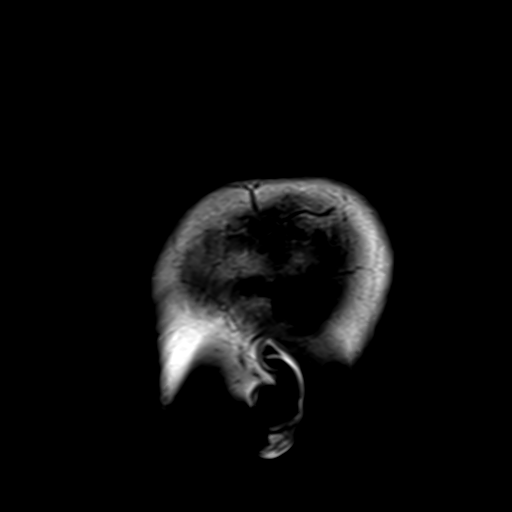
[im 20/20]
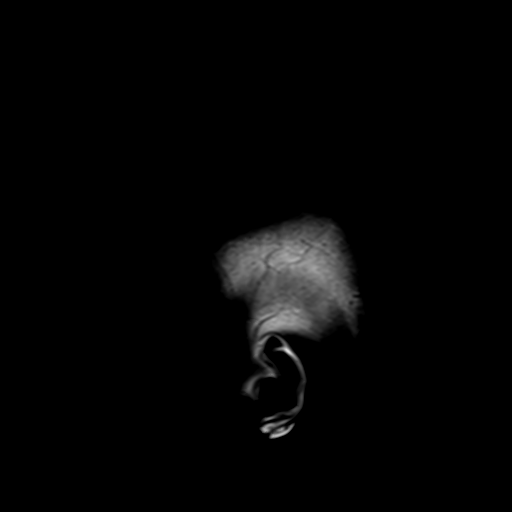

[48 of 48 positions shown; findings below may reference images not displayed]

FINDINGS: MRI HEAD FINDINGS

Brain: There is no acute intracranial hemorrhage, extra-axial fluid
collection, or acute infarct.

Parenchymal volume is normal. The ventricles are normal in size.
Gray-white differentiation is preserved. Scattered small foci of
FLAIR signal abnormality in the subcortical and periventricular
white matter are nonspecific but most likely reflects sequela of
mild chronic white matter microangiopathy.

There is no mass lesion.  There is no mass effect or midline shift.

Vascular: See below.

Skull and upper cervical spine: Normal marrow signal.

Sinuses/Orbits: The paranasal sinuses are clear. The globes and
orbits are unremarkable.

Other: None.

MRA HEAD FINDINGS

Anterior circulation: The intracranial ICAs are patent.

Bilateral MCAs are patent.

The bilateral ACAs are patent. The anterior communicating artery is
normal.

There is no aneurysm or AVM.

Posterior circulation: The bilateral V4 segments are patent. PICA is
identified bilaterally. The basilar artery is patent.

The bilateral PCAs are patent. The posterior communicating arteries
are not definitely seen.

There is no aneurysm or AVM.

Anatomic variants: None.

MRA NECK FINDINGS

Aortic arch: The imaged aortic arch is grossly unremarkable. The
origins of the major branch vessels appear patent.

Right carotid system: Right common, internal, and external carotid
arteries are patent, without evidence of hemodynamically significant
stenosis or occlusion. There is no evidence of dissection or
aneurysm.

Left carotid system: The left common, internal, and external carotid
arteries appear patent, without evidence of hemodynamically
significant stenosis or occlusion. There is no evidence of
dissection or aneurysm.

Vertebral arteries: Vertebral arteries are patent with antegrade
flow. There is no evidence of hemodynamically significant stenosis
or occlusion. There is no evidence of dissection or aneurysm.

Other: None.
IMPRESSION: 1. No acute intracranial pathology.
2. Patent vasculature of the head and neck with no hemodynamically
significant stenosis or occlusion.

## 2021-09-05 MED ORDER — ASPIRIN 81 MG PO CHEW
324.0000 mg | CHEWABLE_TABLET | Freq: Once | ORAL | Status: AC
  Administered 2021-09-05: 324 mg via ORAL
  Filled 2021-09-05: qty 4

## 2021-09-05 MED ORDER — ASPIRIN 325 MG PO TBEC: 325.0000 mg | DELAYED_RELEASE_TABLET | Freq: Every day | ORAL | 0 refills | Status: AC

## 2021-09-05 NOTE — Discharge Instructions (Addendum)
Your MRI and CT scan of the brain did not show any acute strokes or other acute issues.  Please follow-up with neurology or primary care for further evaluation of your symptoms.  Return to the emergency department if you have new or worsening symptoms.

## 2021-09-05 NOTE — ED Provider Notes (Signed)
[facility] Provider Note    Event Date/Time   First MD Initiated Contact with Patient 09/05/21 [phone]     (approximate)   History   Loss of Vision and Jaw Pain   HPI  KEIERA WEISLER is a 61 y.o. female with no significant past medical history who reports that 6 days ago she had an episode of loss of vision on one side associated with headache and feeling off balance.  This episode lasted about 45 minutes and then seemed to resolve on its own.  She reports feeling a little bit dizzy and off balance still over the past 6 days, but denies any falls or trouble with walking, no motor weakness.  Overall states that she feels fine.  She had tried to make an appoint with primary care but was offered an appointment 4 months from now.  She tried to go to the walk-in clinic who directed her to the ED instead.  She denies fevers chills neck pain chest pain shortness of breath or exertional symptoms.     Physical Exam   Triage Vital Signs: ED Triage Vitals  Enc Vitals Group     BP 09/05/21 0829 (!) 158/77     Pulse Rate 09/05/21 0829 61     Resp 09/05/21 0829 16     Temp 09/05/21 0829 98.2 F (36.8 C)     Temp Source 09/05/21 0829 Oral     SpO2 09/05/21 0829 100 %     Weight 09/05/21 0830 200 lb (90.7 kg)     Height 09/05/21 0830 5\' 7"  (1.702 m)     Head Circumference --      Peak Flow --      Pain Score 09/05/21 0829 1     Pain Loc --      Pain Edu? --      Excl. in [place]? --     Most recent vital signs: Vitals:   09/05/21 0829 09/05/21 1134  BP: (!) 158/77 (!) 152/69  Pulse: 61 68  Resp: 16 17  Temp: 98.2 F (36.8 C)   SpO2: 100% 100%     General: Awake, no distress.  CV:  Good peripheral perfusion.  Regular rate and rhythm Resp:  Normal effort.  Clear to auscultation bilaterally Abd:  No distention.  Other:  Normal gait.  Cranial nerves II through XII intact.  PERRL, EOMI, neuro exam benign and nonfocal   ED Results / Procedures / Treatments    Labs (all labs ordered are listed, but only abnormal results are displayed) Labs Reviewed  CBC - Abnormal; Notable for the following components:      Result Value   Platelets 149 (*)    All other components within normal limits  PROTIME-INR  APTT  DIFFERENTIAL  COMPREHENSIVE METABOLIC PANEL     EKG  Interpreted by me Normal sinus rhythm, rate of 61.  Normal axis and intervals.  Normal QRS ST segments and T waves.   RADIOLOGY CT head viewed and interpreted by me, negative for mass or intracranial hemorrhage or obvious infarct.  Radiology report reviewed.  MRI/MRA head and neck pending   PROCEDURES:  Critical Care performed: No  Procedures   MEDICATIONS ORDERED IN ED: Medications  aspirin chewable tablet 324 mg (324 mg Oral Given 09/05/21 0948)     IMPRESSION / MDM / ASSESSMENT AND PLAN / ED COURSE  I reviewed the triage vital signs and the nursing notes.  Patient's presentation is most consistent with acute presentation with potential threat to life or bodily function.   Differential diagnosis includes, but is not limited to, acute ischemic stroke, intracranial hemorrhage, intracranial mass, electrolyte abnormality, anemia, orthostatic symptoms    Patient presents with brief episode of neuro symptoms 6 days ago consistent with TIA.  No significant risk factors.  CT head negative, exam nonfocal without any acute deficits currently.  Presentation discussed with neurology who advises that ABCD2 score is less than 4, so patient can be started on 325 mg daily aspirin, and if MRI/MRA is unremarkable she can be discharged for outpatient management.  ----------------------------------------- 1:21 PM on 09/05/2021 ----------------------------------------- MRI/MRA shows no acute findings.  Patient remains asymptomatic in the ED.  She does not require admission and will be discharged with follow-up information for neurology clinic.       FINAL CLINICAL IMPRESSION(S) / ED DIAGNOSES   Final diagnoses:  TIA (transient ischemic attack)     Rx / DC Orders   ED Discharge Orders          Ordered    aspirin EC 325 MG tablet  Daily        09/05/21 1319             Note:  This document was prepared using Dragon voice recognition software and may include unintentional dictation errors.   Carrie Mew, MD 09/05/21 1321

## 2021-09-05 NOTE — ED Triage Notes (Signed)
Patient reports last Saturday 08/30/21 had episode of loss of vision and bilateral jaw pain 30-45 minutes. Reports ever since has been having slight headaches, dizziness, and off balance.

## 2021-10-17 ENCOUNTER — Other Ambulatory Visit: Payer: Self-pay | Admitting: Internal Medicine

## 2021-10-17 DIAGNOSIS — Z1231 Encounter for screening mammogram for malignant neoplasm of breast: Secondary | ICD-10-CM

## 2021-11-14 ENCOUNTER — Ambulatory Visit
Admission: RE | Admit: 2021-11-14 | Discharge: 2021-11-14 | Disposition: A | Discharge status: Home / Self Care | Payer: BC Managed Care – PPO | Source: Ambulatory Visit | Attending: Internal Medicine | Admitting: Internal Medicine

## 2021-11-14 DIAGNOSIS — Z1231 Encounter for screening mammogram for malignant neoplasm of breast: Secondary | ICD-10-CM | POA: Insufficient documentation

## 2022-04-13 ENCOUNTER — Other Ambulatory Visit: Payer: Self-pay

## 2022-04-13 ENCOUNTER — Emergency Department
Admission: EM | Admit: 2022-04-13 | Discharge: 2022-04-13 | Disposition: A | Discharge status: Home / Self Care | Payer: BC Managed Care – PPO | Attending: Emergency Medicine | Admitting: Emergency Medicine

## 2022-04-13 DIAGNOSIS — R197 Diarrhea, unspecified: Secondary | ICD-10-CM | POA: Insufficient documentation

## 2022-04-13 DIAGNOSIS — M791 Myalgia, unspecified site: Secondary | ICD-10-CM | POA: Diagnosis not present

## 2022-04-13 DIAGNOSIS — E876 Hypokalemia: Secondary | ICD-10-CM | POA: Diagnosis not present

## 2022-04-13 DIAGNOSIS — R1110 Vomiting, unspecified: Secondary | ICD-10-CM | POA: Insufficient documentation

## 2022-04-13 DIAGNOSIS — R519 Headache, unspecified: Secondary | ICD-10-CM | POA: Insufficient documentation

## 2022-04-13 DIAGNOSIS — R109 Unspecified abdominal pain: Secondary | ICD-10-CM | POA: Insufficient documentation

## 2022-04-13 DIAGNOSIS — Z1152 Encounter for screening for COVID-19: Secondary | ICD-10-CM | POA: Diagnosis not present

## 2022-04-13 LAB — URINALYSIS, ROUTINE W REFLEX MICROSCOPIC
Bilirubin Urine: NEGATIVE
Glucose, UA: NEGATIVE mg/dL
Hgb urine dipstick: NEGATIVE
Ketones, ur: NEGATIVE mg/dL
Nitrite: NEGATIVE
Protein, ur: 30 mg/dL — AB
Specific Gravity, Urine: 1.029 (ref 1.005–1.030)
pH: 5 (ref 5.0–8.0)

## 2022-04-13 LAB — BASIC METABOLIC PANEL
Anion gap: 12 (ref 5–15)
BUN: 13 mg/dL (ref 8–23)
CO2: 22 mmol/L (ref 22–32)
Calcium: 9 mg/dL (ref 8.9–10.3)
Chloride: 105 mmol/L (ref 98–111)
Creatinine, Ser: 0.68 mg/dL (ref 0.44–1.00)
GFR, Estimated: >60 mL/min (ref >60)
Glucose, Bld: 115 mg/dL — ABNORMAL HIGH (ref 70–99)
Potassium: 3 mmol/L — ABNORMAL LOW (ref 3.5–5.1)
Sodium: 139 mmol/L (ref 135–145)

## 2022-04-13 LAB — CBC
HCT: 37.1 % (ref 36.0–46.0)
Hemoglobin: 11.9 g/dL — ABNORMAL LOW (ref 12.0–15.0)
MCH: 26.7 pg (ref 26.0–34.0)
MCHC: 32.1 g/dL (ref 30.0–36.0)
MCV: 83.4 fL (ref 80.0–100.0)
Platelets: 136 10*3/uL — ABNORMAL LOW (ref 150–400)
RBC: 4.45 MIL/uL (ref 3.87–5.11)
RDW: 12.4 % (ref 11.5–15.5)
WBC: 4.4 10*3/uL (ref 4.0–10.5)
nRBC: 0 % (ref 0.0–0.2)

## 2022-04-13 LAB — RESP PANEL BY RT-PCR (RSV, FLU A&B, COVID)  RVPGX2
Influenza A by PCR: NEGATIVE
Influenza B by PCR: NEGATIVE
Resp Syncytial Virus by PCR: NEGATIVE
SARS Coronavirus 2 by RT PCR: NEGATIVE

## 2022-04-13 LAB — HEPATIC FUNCTION PANEL
ALT: 17 U/L (ref 0–44)
AST: 23 U/L (ref 15–41)
Albumin: 3.7 g/dL (ref 3.5–5.0)
Alkaline Phosphatase: 84 U/L (ref 38–126)
Bilirubin, Direct: 0.1 mg/dL (ref 0.0–0.2)
Indirect Bilirubin: 0.6 mg/dL (ref 0.3–0.9)
Total Bilirubin: 0.7 mg/dL (ref 0.3–1.2)
Total Protein: 7.1 g/dL (ref 6.5–8.1)

## 2022-04-13 MED ORDER — POTASSIUM CHLORIDE CRYS ER 20 MEQ PO TBCR
40.0000 meq | EXTENDED_RELEASE_TABLET | Freq: Once | ORAL | Status: AC
  Administered 2022-04-13: 40 meq via ORAL
  Filled 2022-04-13: qty 2

## 2022-04-13 MED ORDER — ACETAMINOPHEN 500 MG PO TABS
1000.0000 mg | ORAL_TABLET | Freq: Once | ORAL | Status: AC
  Administered 2022-04-13: 1000 mg via ORAL
  Filled 2022-04-13: qty 2

## 2022-04-13 MED ORDER — LACTATED RINGERS IV BOLUS
1000.0000 mL | Freq: Once | INTRAVENOUS | Status: AC
  Administered 2022-04-13: 1000 mL via INTRAVENOUS

## 2022-04-13 MED ORDER — ONDANSETRON HCL 4 MG/2ML IJ SOLN
4.0000 mg | Freq: Once | INTRAMUSCULAR | Status: AC
  Administered 2022-04-13: 4 mg via INTRAVENOUS
  Filled 2022-04-13: qty 2

## 2022-04-13 MED ORDER — ONDANSETRON 4 MG PO TBDP: 4.0000 mg | ORAL_TABLET | Freq: Three times a day (TID) | ORAL | 0 refills | Status: AC | PRN

## 2022-04-13 NOTE — ED Provider Notes (Signed)
[facility] Provider Note    Event Date/Time   First MD Initiated Contact with Patient 04/13/22 1144     (approximate)   History   Headache   HPI  DIEM DICOCCO is a 61 y.o. female who presents with nausea vomiting headache body aches fever.  Symptoms started yesterday.  She has had multiple episodes of emesis is green and burns her throat when she vomits.  Also was having significant diarrhea it is also green nonbloody.  She was having some abdominal cramping but no abdominal pain now.  Felt weak she had fever at home but temp was 96.  Denies cough congestion shortness of breath or chest pain.  Denies urinary symptoms.  Does complain of headache and diffuse bodyaches.  Says multiple family members have been sick with viral illness. No past medical history on file.  There are no problems to display for this patient.    Physical Exam  Triage Vital Signs: ED Triage Vitals  Enc Vitals Group     BP 04/13/22 1127 (!) 108/58     Pulse Rate 04/13/22 1127 71     Resp 04/13/22 1127 18     Temp 04/13/22 1127 99.9 F (37.7 C)     Temp Source 04/13/22 1127 Oral     SpO2 04/13/22 1127 100 %     Weight 04/13/22 1130 210 lb (95.3 kg)     Height --      Head Circumference --      Peak Flow --      Pain Score 04/13/22 1130 0     Pain Loc --      Pain Edu? --      Excl. in [place]? --     Most recent vital signs: Vitals:   04/13/22 1127  BP: (!) 108/58  Pulse: 71  Resp: 18  Temp: 99.9 F (37.7 C)  SpO2: 100%     General: Awake, no distress.  CV:  Good peripheral perfusion.  Resp:  Normal effort.  Abd:  No distention.  Soft nontender throughout Neuro:             Awake, Alert, Oriented x 3  Other:  Dry MM   ED Results / Procedures / Treatments  Labs (all labs ordered are listed, but only abnormal results are displayed) Labs Reviewed  BASIC METABOLIC PANEL - Abnormal; Notable for the following components:      Result Value   Potassium 3.0 (*)     Glucose, Bld 115 (*)    All other components within normal limits  URINALYSIS, ROUTINE W REFLEX MICROSCOPIC - Abnormal; Notable for the following components:   Color, Urine AMBER (*)    APPearance CLOUDY (*)    Protein, ur 30 (*)    Leukocytes,Ua LARGE (*)    Bacteria, UA FEW (*)    Non Squamous Epithelial PRESENT (*)    All other components within normal limits  CBC - Abnormal; Notable for the following components:   Hemoglobin 11.9 (*)    Platelets 136 (*)    All other components within normal limits  RESP PANEL BY RT-PCR (RSV, FLU A&B, COVID)  RVPGX2  HEPATIC FUNCTION PANEL  CBG MONITORING, ED     EKG     RADIOLOGY    PROCEDURES:  Critical Care performed: No  Procedures  The patient is on the cardiac monitor to evaluate for evidence of arrhythmia and/or significant heart rate changes.   MEDICATIONS ORDERED IN ED: Medications  ondansetron [facility]) injection 4 mg (4 mg Intravenous Given 04/13/22 1230)  lactated ringers bolus 1,000 mL (1,000 mLs Intravenous New Bag/Given 04/13/22 1228)  acetaminophen (TYLENOL) tablet 1,000 mg (1,000 mg Oral Given 04/13/22 1229)  potassium chloride SA (KLOR-CON M) CR tablet 40 mEq (40 mEq Oral Given 04/13/22 1229)     IMPRESSION / MDM / ASSESSMENT AND PLAN / ED COURSE  I reviewed the triage vital signs and the nursing notes.                              Patient's presentation is most consistent with acute complicated illness / injury requiring diagnostic workup.  Differential diagnosis includes, but is not limited to, viral illness including influenza, viral gastroenteritis, hypovolemia, AKI, electrolyte abnormality  The patient is a 61 year old female presenting with multiple symptoms including vomiting diarrhea headache body aches chills.  This been going on since yesterday.  Temp 99.9 in triage blood pressures on the softer side but map above 65.  Patient overall looks well abdominal exam is benign, does look somewhat dry.  I  suspect this is likely a viral illness.  Will give fluids, check COVID and influenza swab give Zofran and Tylenol.  Patient's labs are overall reassuring.  No leukocytosis or anemia.  Potassium is mildly low at 3 but she was able to tolerate oral potassium.  Suspect this is in the setting of vomiting and diarrhea.  He has 21-50 white cells but it is a contaminated sample patient's not having urinary symptoms will not treat.  COVID flu is negative.  Patient feeling improved after Tylenol Zofran and a liter of fluid.  No longer complains of headache.  She is tolerating p.o.  I think that she will be appropriate for discharge.       FINAL CLINICAL IMPRESSION(S) / ED DIAGNOSES   Final diagnoses:  Vomiting and diarrhea  Hypokalemia     Rx / DC Orders   ED Discharge Orders     None        Note:  This document was prepared using Dragon voice recognition software and may include unintentional dictation errors.   Georga Hacking, MD 04/13/22 1351

## 2022-04-13 NOTE — Discharge Instructions (Addendum)
Your blood work was reassuring other than your potassium was slightly low.  Please continue to drink fluids with some electrolytes such as Pedialyte or Gatorade.  You can take Tylenol for headache.  I have also prescribed Zofran in case you have ongoing nausea and vomiting.  Please rest.  You likely have a viral illness this will take several days to resolve.  If you are developing abdominal pain or any new symptoms that are concerning to you please return to the emergency department.

## 2022-04-13 NOTE — ED Triage Notes (Signed)
Pt states coming in due to a headache, dizziness, weakness, diarrhea, nausea and vomiting. Pt states this started Saturday morning. Pt states she thought it was going to be a 24 hr thing, but it is not leaving. Pt states she went out Friday night and the food did not taste right.

## 2022-11-13 ENCOUNTER — Other Ambulatory Visit: Payer: Self-pay | Admitting: Internal Medicine

## 2022-11-13 DIAGNOSIS — Z1231 Encounter for screening mammogram for malignant neoplasm of breast: Secondary | ICD-10-CM

## 2022-11-24 ENCOUNTER — Ambulatory Visit
Admission: RE | Admit: 2022-11-24 | Discharge: 2022-11-24 | Disposition: A | Discharge status: Home / Self Care | Payer: BC Managed Care – PPO | Source: Ambulatory Visit | Attending: Internal Medicine | Admitting: Internal Medicine

## 2022-11-24 DIAGNOSIS — Z1231 Encounter for screening mammogram for malignant neoplasm of breast: Secondary | ICD-10-CM | POA: Diagnosis present
